# Patient Record
Sex: Male | Born: 1980 | Race: Black or African American | Hispanic: No | Marital: Single | Smoking: Never smoker
Health system: Southern US, Community
[De-identification: ages and names within clinical notes are randomized; demographics above are authoritative.]

## PROBLEM LIST (undated history)

## (undated) DIAGNOSIS — Z789 Other specified health status: Secondary | ICD-10-CM

## (undated) DIAGNOSIS — F99 Mental disorder, not otherwise specified: Secondary | ICD-10-CM

## (undated) HISTORY — PX: NO PAST SURGERIES: SHX2092

---

## 2012-07-27 ENCOUNTER — Ambulatory Visit (HOSPITAL_COMMUNITY)
Admission: RE | Admit: 2012-07-27 | Discharge: 2012-07-27 | Disposition: A | Discharge status: Home / Self Care | Payer: Self-pay | Attending: Psychiatry | Admitting: Psychiatry

## 2012-07-27 ENCOUNTER — Encounter (HOSPITAL_COMMUNITY): Payer: Self-pay

## 2012-07-27 ENCOUNTER — Emergency Department (HOSPITAL_COMMUNITY)
Admission: EM | Admit: 2012-07-27 | Discharge: 2012-07-28 | Disposition: A | Discharge status: Other Acute Inpatient Hospital | Payer: Self-pay | Attending: Emergency Medicine | Admitting: Emergency Medicine

## 2012-07-27 ENCOUNTER — Encounter (HOSPITAL_COMMUNITY): Payer: Self-pay | Admitting: Licensed Clinical Social Worker

## 2012-07-27 DIAGNOSIS — F29 Unspecified psychosis not due to a substance or known physiological condition: Secondary | ICD-10-CM | POA: Insufficient documentation

## 2012-07-27 DIAGNOSIS — Z91018 Allergy to other foods: Secondary | ICD-10-CM | POA: Insufficient documentation

## 2012-07-27 DIAGNOSIS — R443 Hallucinations, unspecified: Secondary | ICD-10-CM | POA: Insufficient documentation

## 2012-07-27 DIAGNOSIS — Z833 Family history of diabetes mellitus: Secondary | ICD-10-CM | POA: Insufficient documentation

## 2012-07-27 HISTORY — DX: Other specified health status: Z78.9

## 2012-07-27 LAB — RAPID URINE DRUG SCREEN, HOSP PERFORMED
Amphetamines: NOT DETECTED
Barbiturates: NOT DETECTED
Opiates: NOT DETECTED
Tetrahydrocannabinol: NOT DETECTED

## 2012-07-27 LAB — CBC WITH DIFFERENTIAL/PLATELET
Basophils Absolute: 0 10*3/uL (ref 0.0–0.1)
Basophils Relative: 0 % (ref 0–1)
Eosinophils Absolute: 0 10*3/uL (ref 0.0–0.7)
Hemoglobin: 14.5 g/dL (ref 13.0–17.0)
MCH: 28.8 pg (ref 26.0–34.0)
MCHC: 35.1 g/dL (ref 30.0–36.0)
Neutro Abs: 3.1 10*3/uL (ref 1.7–7.7)
Neutrophils Relative %: 64 % (ref 43–77)
Platelets: 249 10*3/uL (ref 150–400)
RDW: 11.8 % (ref 11.5–15.5)

## 2012-07-27 LAB — COMPREHENSIVE METABOLIC PANEL
AST: 23 U/L (ref 0–37)
Albumin: 4.5 g/dL (ref 3.5–5.2)
Alkaline Phosphatase: 76 U/L (ref 39–117)
BUN: 8 mg/dL (ref 6–23)
Chloride: 98 mEq/L (ref 96–112)
Potassium: 3.7 mEq/L (ref 3.5–5.1)
Sodium: 134 mEq/L — ABNORMAL LOW (ref 135–145)
Total Bilirubin: 1 mg/dL (ref 0.3–1.2)
Total Protein: 8.3 g/dL (ref 6.0–8.3)

## 2012-07-27 MED ORDER — ZIPRASIDONE MESYLATE 20 MG IM SOLR
20.0000 mg | Freq: Once | INTRAMUSCULAR | Status: AC
  Administered 2012-07-28: 20 mg via INTRAMUSCULAR
  Filled 2012-07-27: qty 20

## 2012-07-27 MED ORDER — LORAZEPAM 1 MG PO TABS: 1.0000 mg | ORAL_TABLET | Freq: Three times a day (TID) | ORAL | Status: DC | PRN

## 2012-07-27 MED ORDER — ACETAMINOPHEN 325 MG PO TABS: 650.0000 mg | ORAL_TABLET | ORAL | Status: DC | PRN

## 2012-07-27 MED ORDER — IBUPROFEN 600 MG PO TABS: 600.0000 mg | ORAL_TABLET | Freq: Three times a day (TID) | ORAL | Status: DC | PRN

## 2012-07-27 NOTE — BH Assessment (Signed)
Assessment Note   Gerald Ballard is an 32 y.o. male, single, African-American who presents to [facility] for assessment accompanied by his aunt who participated in the assessment at the Pt's request. Pt states he is here because he can make time move forward and backward through the television. Pt appears very delusional and talks about another reality where he "is big" and that here people try to take things from him. He says "there is a black man and a white man who are on life support and one has to leave... What do you do? That is the question." He is not oriented to date, place or situation and could not answer some questions appropriately. He could not appropriately answer if he is experiencing auditory or visual hallucination. He denies being depressed. He denies suicidal ideation or history of self-harm. He denies homicidal ideation or history of violence. He denies legal charges. He cannot appropriately answer if he is employed. When asked if he uses alcohol or substances he replied "Ahh! That is what I want to know. We should table this." He cannot says how long he has had these symptoms.   Pt's aunt reports Pt came to their residence saying he didn't feel safe and wanted to stay at their house. Aunt says Pt has moved four times over a brief period and they asked where he was staying but he couldn't remember the address. He eventually led them to an apartment the Pt shares with 3 other men and Pt had covered all the vent because he believe people are trying to contaminate him. He believes people in other apartments are talking about him. "I don't want to be outside because there is too much outside so it is better to stay in my room asleep... I fear for my safety." Aunt reports Pt moved to [place] from [place] four years ago. Per aunt he lost his job at [employer] approximately 1 year ago and she doesn't think he has been working. Aunt is not aware of any family history of mental health problems and does not believe  Pt has seen a psychiatrist or counselor in the past.  During assessment Pt was hypervigilant and preoccupied with small details. He talked about my "specticals" and that he had "seen in another realm that you are the person who wears specticals." He was pleasant, cooperative and followed directions but has poor insight and judgment.  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 No pertinent past medical history    Axis IV: economic problems, occupational problems and problems with access to health care services Axis V: GAF = 20  Past Medical History:  Past Medical History  Diagnosis Date  . No pertinent past medical history     Past Surgical History  Procedure Date  . No past surgeries     Family History:  Family History  Problem Relation Age of Onset  . Diabetes Father     Social History:  reports that he has never smoked. He does not have any smokeless tobacco history on file. He reports that he drinks alcohol. He reports that he does not use illicit drugs.  Additional Social History:  Alcohol / Drug Use Pain Medications: Unknown Prescriptions: Unknown Over the Counter: Unknown History of alcohol / drug use?: No history of alcohol / drug abuse Longest period of sobriety (when/how long): NA  CIWA:   COWS:    Allergies:  Allergies  Allergen  Reactions  . Pork-Derived Products     unknown    Home Medications:  (Not in a hospital admission)  OB/GYN Status:  No LMP for male patient.  General Assessment Data Location of Assessment: [facility] Assessment Services Living Arrangements: Other (Comment) (Roommates) Can pt return to current living arrangement?: Yes Admission Status: Voluntary Is patient capable of signing voluntary admission?: Yes Transfer from: Home Referral Source: Self/Family/Friend (Family)  Education Status Is patient currently in school?: No Current Grade: NA Highest grade of school  patient has completed: NA Name of school: NA Contact person: NA  Risk to self Suicidal Ideation: No Suicidal Intent: No Is patient at risk for suicide?: No Suicidal Plan?: No Access to Means: No What has been your use of drugs/alcohol within the last 12 months?: Pt unable to answer Previous Attempts/Gestures: No How many times?: 0  Other Self Harm Risks: None Triggers for Past Attempts: None known Intentional Self Injurious Behavior: None Family Suicide History: No Recent stressful life event(s): Job Loss;Financial Problems Persecutory voices/beliefs?: Yes (Apartment is contaminated, people talking about him) Depression: No Depression Symptoms: Insomnia;Loss of interest in usual pleasures;Isolating Substance abuse history and/or treatment for substance abuse?: No Suicide prevention information given to non-admitted patients: Not applicable  Risk to Others Homicidal Ideation: No Thoughts of Harm to Others: No Current Homicidal Intent: No Current Homicidal Plan: No Access to Homicidal Means: No Identified Victim: None History of harm to others?: No Assessment of Violence: None Noted Violent Behavior Description: Pt denies any history of violence Does patient have access to weapons?: No Criminal Charges Pending?: No Does patient have a court date: No  Psychosis Hallucinations: None noted (Unclear whether Pt is responding to hallucinations) Delusions: Grandiose;Persecutory (Believes he is viewing past and future events)  Mental Status Report Appear/Hygiene: Other (Comment) (Casually dressed) Eye Contact: Good Motor Activity: Restlessness Speech: Tangential Level of Consciousness: Alert Mood: Preoccupied Affect: Preoccupied Anxiety Level: Minimal Thought Processes: Tangential Judgement: Impaired Orientation: Person (Not oriented to date, place or situation) Obsessive Compulsive Thoughts/Behaviors: None  Cognitive Functioning Concentration: Decreased Memory: Recent  Intact;Remote Intact IQ: Average Insight: Poor Impulse Control: Fair Appetite: Fair Weight Loss: 0  Weight Gain: 0  Sleep: Decreased Total Hours of Sleep: 6  Vegetative Symptoms: None  ADLScreening [facility] Assessment Services) Patient's cognitive ability adequate to safely complete daily activities?: Yes Patient able to express need for assistance with ADLs?: Yes Independently performs ADLs?: Yes (appropriate for developmental age)  Abuse/Neglect [place] [place]) Physical Abuse: Denies Verbal Abuse: Denies Sexual Abuse: Denies  Prior Inpatient Therapy Prior Inpatient Therapy: No Prior Therapy Dates: NA Prior Therapy Facilty/Provider(s): NA Reason for Treatment: NA  Prior Outpatient Therapy Prior Outpatient Therapy: No Prior Therapy Dates: NA Prior Therapy Facilty/Provider(s): NA Reason for Treatment: NA  ADL Screening (condition at time of admission) Patient's cognitive ability adequate to safely complete daily activities?: Yes Patient able to express need for assistance with ADLs?: Yes Independently performs ADLs?: Yes (appropriate for developmental age) Weakness of Legs: None Weakness of Arms/Hands: None  Home Assistive Devices/Equipment Home Assistive Devices/Equipment: None    Abuse/Neglect Assessment (Assessment to be complete while patient is alone) Physical Abuse: Denies Verbal Abuse: Denies Sexual Abuse: Denies Exploitation of patient/patient's resources: Denies Self-Neglect: Denies     Merchant navy officer (For Healthcare) Advance Directive: Patient does not have advance directive;Patient would not like information Pre-existing out of facility DNR order (yellow form or pink MOST form): No Nutrition Screen- [facility] Adult/[facility] Patient's home diet: Regular Have you recently lost weight without trying?: No  Have you been eating poorly because of a decreased appetite?: No Malnutrition Screening Tool Score: 0   Additional Information 1:1 In Past 12 Months?: No CIRT Risk:  No Elopement Risk: No Does patient have medical clearance?: No     Disposition:  Disposition Disposition of Patient: Other dispositions Other disposition(s): Other (Comment) (Transfer to [facility] for medical clearance)  On Site Evaluation by:   Reviewed with Physician: Armandina Stammer, NP  Consulted with Thurman Coyer, [facility] who confirmed no 400-hall beds are currently available. Consulted with Armandina Stammer, NP who agreed Pt met criteria for inpatient treatment and recommended Pt be transferred to [facility] for medical clearance. Pt has not been accepted or declined at this time. Contacted Patty, Consulting civil engineer at [employer], and gave report. Pt agrees to go to [facility] for medical clearance. Pt transported to [facility] via security and [facility] staff.    Patsy Baltimore, Harlin Rain 07/27/2012 3:41 PM

## 2012-07-27 NOTE — ED Provider Notes (Addendum)
History     CSN: [id number]  Arrival date & time 07/27/12  1459   First MD Initiated Contact with Patient 07/27/12 1719      Chief Complaint  Patient presents with  . Medical Clearance    (Consider location/radiation/quality/duration/timing/severity/associated sxs/prior treatment) The history is provided by the patient.  Gerald Ballard is a 32 y.o. male here with hallucinations. Was watching the movie "Helix" and then was hearing voices. He said that every channel he turns to, he sees the police and he is afraid. He also sess electromagnetic waves through the television. No suicidal or homicidal ideations. No psych history in the past.    Past Medical History  Diagnosis Date  . No pertinent past medical history     Past Surgical History  Procedure Date  . No past surgeries     Family History  Problem Relation Age of Onset  . Diabetes Father     History  Substance Use Topics  . Smoking status: Never Smoker   . Smokeless tobacco: Not on file  . Alcohol Use: Yes     Comment: wine occasionally      Review of Systems  Psychiatric/Behavioral: Positive for hallucinations.  All other systems reviewed and are negative.    Allergies  Pork-derived products  Home Medications  No current outpatient prescriptions on file.  BP 140/89  Pulse 77  Temp 98.2 F (36.8 C) (Oral)  Resp 18  SpO2 99%  Physical Exam  Nursing note and vitals reviewed. Constitutional: He is oriented to person, place, and time. He appears well-developed and well-nourished.  HENT:  Head: Normocephalic.  Mouth/Throat: Oropharynx is clear and moist.  Eyes: Conjunctivae normal are normal. Pupils are equal, round, and reactive to light.  Neck: Normal range of motion. Neck supple.  Cardiovascular: Normal rate, regular rhythm and normal heart sounds.   Pulmonary/Chest: Effort normal and breath sounds normal. No respiratory distress. He has no wheezes. He has no rales.  Abdominal: Soft. Bowel sounds  are normal. He exhibits no distension. There is no tenderness. There is no rebound.  Musculoskeletal: Normal range of motion.  Neurological: He is alert and oriented to person, place, and time.  Skin: Skin is warm and dry.  Psychiatric:       Nl mood, poor judgment.     ED Course  Procedures (including critical care time)  Labs Reviewed  COMPREHENSIVE METABOLIC PANEL - Abnormal; Notable for the following:    Sodium 134 (*)     Glucose, Bld 107 (*)     All other components within normal limits  CBC WITH DIFFERENTIAL  URINE RAPID DRUG SCREEN (HOSP PERFORMED)  ETHANOL   No results found.   1. Hallucinations       MDM  Gerald Ballard is a 32 y.o. male here with hallucinations and psychosis. Likely new onset schizophrenia. Will check labs, UDS and get psych consult.    10:39 PM UDS negative. Labs unremarkable. Pending telepsych.   11:40 PM Patient agitated and endangering staff. Will give geodon. Will fill out IVC paperwork.     Richardean Canal, MD 07/27/12 1738  Richardean Canal, MD 07/27/12 2239  Richardean Canal, MD 07/27/12 [phone]

## 2012-07-27 NOTE — ED Notes (Signed)
Patient reports that he has been watching the movie "Helix" and thinks that those things will be happening to him. Patient states that he sees electromagnetic waves especially through the television. Patient states he hears voices and when he moves to one side of the other someone moves to the left or to the right and people have special interests which are going to hurt him and he feels afraid. Patient frequently states that time moves forward and when it does he is gong to gaet hurt and his goal is to keep everything in the middle because he does not know what interests people have.

## 2012-07-27 NOTE — ED Notes (Signed)
Wanded by security. Patient's belongings x 1 bag taken by the patient's aunt.

## 2012-07-28 ENCOUNTER — Inpatient Hospital Stay (HOSPITAL_COMMUNITY)
Admission: AD | Admit: 2012-07-28 | Discharge: 2012-08-09 | DRG: 885 | Disposition: A | Discharge status: Home / Self Care | Payer: Federal, State, Local not specified - Other | Source: Ambulatory Visit | Attending: Psychiatry | Admitting: Psychiatry

## 2012-07-28 ENCOUNTER — Emergency Department (HOSPITAL_COMMUNITY): Payer: Self-pay

## 2012-07-28 DIAGNOSIS — Z79899 Other long term (current) drug therapy: Secondary | ICD-10-CM

## 2012-07-28 DIAGNOSIS — F062 Psychotic disorder with delusions due to known physiological condition: Secondary | ICD-10-CM | POA: Diagnosis present

## 2012-07-28 DIAGNOSIS — F29 Unspecified psychosis not due to a substance or known physiological condition: Principal | ICD-10-CM | POA: Diagnosis present

## 2012-07-28 DIAGNOSIS — F22 Delusional disorders: Secondary | ICD-10-CM | POA: Diagnosis present

## 2012-07-28 HISTORY — DX: Mental disorder, not otherwise specified: F99

## 2012-07-28 MED ORDER — ZIPRASIDONE HCL 20 MG PO CAPS: 20.0000 mg | ORAL_CAPSULE | Freq: Two times a day (BID) | ORAL | Status: DC

## 2012-07-28 NOTE — ED Notes (Signed)
Pt agitated pacing hallway stating "Im leaving". Pt informed that he is IVC and cant leave.

## 2012-07-28 NOTE — [facility] Counselor (Signed)
Per Minerva Areola K([facility]), pt accepted for treatment; attending physician Dr. Ambrose Pancoast

## 2012-07-28 NOTE — Progress Notes (Signed)
[facility] ED CM reviewed ED clinical summary and EDP note Clinical values including VSS Pt from [facility] to [facility] ED for medical clearance No beds available per [facility] staff on time of transfer to [facility] ED.  Needs inquiry on available bed for pt now pt medically cleared

## 2012-07-28 NOTE — ED Provider Notes (Signed)
950 pm pt alert pleasant cooperative, gcs 15. Stable for transfer to [facility]  Doug Sou, MD 07/28/12 2158

## 2012-07-28 NOTE — ED Provider Notes (Addendum)
0720.  Pt sleep in bed, NAD.  No acute overnight events reported.  Pt presented secondary to hallucinations.  Note mild hyponatremia but otherwise normal labs.  Placed consult to telepsych provider for recommendations regarding his care and disposition.  Noted mild tachycardia on last available vital signs.  Will follow closely and if necessary, will broaden his evaluation.   Tobin Chad, MD 07/28/12 0801  1120.  Tachycardia has been resolved.  He was evaluated by Dr. Berlin Hun (telepsych provider) and recommended for inpt placement secondary to psychosis.  Will consult the ACT Team to assist with placement.   Tobin Chad, MD 07/28/12 1124

## 2012-07-28 NOTE — ED Notes (Signed)
Dad at beside.

## 2012-07-29 ENCOUNTER — Encounter (HOSPITAL_COMMUNITY): Payer: Self-pay

## 2012-07-29 DIAGNOSIS — F29 Unspecified psychosis not due to a substance or known physiological condition: Principal | ICD-10-CM

## 2012-07-29 DIAGNOSIS — F062 Psychotic disorder with delusions due to known physiological condition: Secondary | ICD-10-CM | POA: Diagnosis present

## 2012-07-29 MED ORDER — LORAZEPAM 1 MG PO TABS
2.0000 mg | ORAL_TABLET | Freq: Four times a day (QID) | ORAL | Status: DC | PRN
  Administered 2012-07-29: 2 mg via ORAL
  Filled 2012-07-29 (×2): qty 2

## 2012-07-29 MED ORDER — ACETAMINOPHEN 325 MG PO TABS: 650.0000 mg | ORAL_TABLET | Freq: Four times a day (QID) | ORAL | Status: DC | PRN

## 2012-07-29 MED ORDER — HYDROXYZINE HCL 25 MG PO TABS: 25.0000 mg | ORAL_TABLET | Freq: Four times a day (QID) | ORAL | Status: DC | PRN

## 2012-07-29 MED ORDER — ALUM & MAG HYDROXIDE-SIMETH 200-200-20 MG/5ML PO SUSP: 30.0000 mL | ORAL | Status: DC | PRN

## 2012-07-29 MED ORDER — HALOPERIDOL LACTATE 2 MG/ML PO CONC
2.0000 mg | Freq: Two times a day (BID) | ORAL | Status: DC
  Administered 2012-07-29 – 2012-08-01 (×6): 2 mg via ORAL
  Filled 2012-07-29 (×14): qty 1

## 2012-07-29 MED ORDER — OLANZAPINE 10 MG PO TBDP: 10.0000 mg | ORAL_TABLET | Freq: Three times a day (TID) | ORAL | Status: DC | PRN

## 2012-07-29 MED ORDER — MAGNESIUM HYDROXIDE 400 MG/5ML PO SUSP: 30.0000 mL | Freq: Every day | ORAL | Status: DC | PRN

## 2012-07-29 MED ORDER — MIRTAZAPINE 15 MG PO TBDP
15.0000 mg | ORAL_TABLET | Freq: Every day | ORAL | Status: DC
  Administered 2012-07-29 – 2012-08-03 (×5): 15 mg via ORAL
  Filled 2012-07-29 (×7): qty 1

## 2012-07-29 MED ORDER — LISINOPRIL 10 MG PO TABS
10.0000 mg | ORAL_TABLET | Freq: Every day | ORAL | Status: DC
  Administered 2012-07-30 – 2012-08-09 (×11): 10 mg via ORAL
  Filled 2012-07-29: qty 14
  Filled 2012-07-29 (×10): qty 1
  Filled 2012-07-29: qty 14
  Filled 2012-07-29 (×2): qty 1

## 2012-07-29 MED ORDER — BENZTROPINE MESYLATE 0.5 MG PO TABS
0.5000 mg | ORAL_TABLET | Freq: Two times a day (BID) | ORAL | Status: DC
  Administered 2012-07-29 – 2012-08-01 (×6): 0.5 mg via ORAL
  Filled 2012-07-29 (×8): qty 1

## 2012-07-29 MED ORDER — LISINOPRIL 10 MG PO TABS
10.0000 mg | ORAL_TABLET | Freq: Three times a day (TID) | ORAL | Status: DC
  Administered 2012-07-29 (×2): 10 mg via ORAL
  Filled 2012-07-29 (×7): qty 1

## 2012-07-29 NOTE — Clinical Social Work Note (Signed)
[facility] LCSW Group Therapy   Type of Therapy:  Discharge Planning  Participation Level:  Did Not Attend   Clide Dales 07/29/2012 10:01 AM

## 2012-07-29 NOTE — H&P (Signed)
Psychiatric Admission Assessment Adult  Patient Identification:  Gerald Ballard Date of Evaluation:  07/29/2012  Chief Complaint:  Hearing voices, seeing things, paranoid, disorganized thinking  History of Present Illness::Gerald Ballard is an 32 y/o male, immigrant from [place] who was brought to the ED by his Aunt for evaluation. Patient reports that his roommates has been doing "weird stuffs" in the apartment he shares with them. He also reports that he recently saw a movie entitled "Helix" where a patient went to the hospital and never came back home. He reports he has since been thinking that could be him. Patient reports seeing things in his dream, hearing voices of no one in particular and has not been sleeping well, worry about "being cut open" in his sleep. He also states that  he can make time move forward and backward through the television.   He denies suicidal ideation or history of self-harm. He denies homicidal ideation or history of violence. According to the ER records, his aunt reports that he  came to their residence saying he didn't feel safe and wanted to stay at their house. Aunt says patient has moved four times over a brief period and they asked where he was staying but he couldn't remember the address. He eventually led them to an apartment the he shares with 3 other men and reports that he  had covered all the vents because he believe people are trying to contaminate him. He believes people in other apartments are talking about him. "I don't want to be outside because there is too much outside so it is better to stay in my room asleep... I fear for my safety." Aunt reports patient  moved to [place]  from [place] four years ago. He lost  his job at [employer] about one year ago. Aunt is not aware of any family history of mental health problems and does not believe he has seen a psychiatrist or counselor in the past.   Elements:  Location:  [facility] inpatient.. Quality:  patient reports  psychosis, weird dream . Severity:  symptoms severe enough to prevent patient from sleeping.. Timing:  onset unknown. Duration:  patient unable to report the duration of his sympotms.. Context:  unemployed, thinking that roommates are doing weird suffs. Associated Signs/Synptoms: Depression Symptoms:  disturbed sleep, (Hypo) Manic Symptoms:  Delusions, Distractibility, Hallucinations, Anxiety Symptoms:  Excessive Worry, Psychotic Symptoms:  Delusions, Hallucinations: Auditory Visual Paranoia, PTSD Symptoms: None reported  Psychiatric Specialty Exam: Physical Exam  Psychiatric: His mood appears anxious. His speech is tangential. He is actively hallucinating. Thought content is paranoid. Cognition and memory are normal. He expresses inappropriate judgment.    Review of Systems  Constitutional: Negative.   HENT: Negative.   Eyes: Negative.   Respiratory: Negative.   Cardiovascular: Negative.   Gastrointestinal: Negative.   Genitourinary: Negative.   Musculoskeletal: Negative.   Skin: Negative.   Neurological: Negative.   Endo/Heme/Allergies: Negative.   Psychiatric/Behavioral: Positive for hallucinations. The patient is nervous/anxious and has insomnia.     Blood pressure 125/91, pulse 134, temperature 98.2 F (36.8 C), temperature source Oral, resp. rate 20, height 5\' 9"  (1.753 m), weight 51.71 kg (114 lb).Body mass index is 16.83 kg/(m^2).  General Appearance: Bizarre, Disheveled and Guarded  Eye Contact::  Poor  Speech:  Blocked and Slow  Volume:  Decreased  Mood:  Anxious  Affect:  Blunt  Thought Process:  Circumstantial and Disorganized  Orientation:  Other:  oriented to place, person but not to  time  Thought Content:  Delusions, Hallucinations: Auditory Visual and Paranoid Ideation  Suicidal Thoughts:  No  Homicidal Thoughts:  No  Memory:  Immediate;   Fair Recent;   Fair Remote;   Fair  Judgement:  Poor  Insight:  Lacking  Psychomotor Activity:  Psychomotor  Retardation  Concentration:  Poor  Recall:  Fair  Akathisia:  No  Handed:  Right  AIMS (if indicated):     Assets:  Physical Health Social Support  Sleep:       Past Psychiatric History: Diagnosis:  Hospitalizations:  Outpatient Care:  Substance Abuse Care:  Self-Mutilation:  Suicidal Attempts:  Violent Behaviors:   Past Medical History:   Past Medical History  Diagnosis Date  . No pertinent past medical history   . Mental disorder     Allergies:   Allergies  Allergen Reactions  . Pork-Derived Products     unknown   PTA Medications: No prescriptions prior to admission    Previous Psychotropic Medications:  Medication/Dose: none in the past                 Substance Abuse History in the last 12 months:  no  Consequences of Substance Abuse: NA  Social History:  reports that he has never smoked. He does not have any smokeless tobacco history on file. He reports that he drinks alcohol. He reports that he does not use illicit drugs. Additional Social History:                      Current Place of Residence:   Place of Birth:   Family Members: Marital Status:  Single Children:  Sons:  Daughters: Relationships: Education:  Corporate treasurer Problems/Performance: Religious Beliefs/Practices: History of Abuse (Emotional/Phsycial/Sexual) Teacher, music History:  None. Legal History: Hobbies/Interests:  Family History:   Family History  Problem Relation Age of Onset  . Diabetes Father     Results for orders placed during the hospital encounter of 07/27/12 (from the past 72 hour(s))  CBC WITH DIFFERENTIAL     Status: Normal   Collection Time   07/27/12  4:05 PM      Component Value Range Comment   WBC 4.9  4.0 - 10.5 K/uL    RBC 5.04  4.22 - 5.81 MIL/uL    Hemoglobin 14.5  13.0 - 17.0 g/dL    HCT 40.9  81.1 - 91.4 %    MCV 81.9  78.0 - 100.0 fL    MCH 28.8  26.0 - 34.0 pg    MCHC 35.1  30.0 - 36.0 g/dL     RDW 78.2  95.6 - 21.3 %    Platelets 249  150 - 400 K/uL    Neutrophils Relative 64  43 - 77 %    Neutro Abs 3.1  1.7 - 7.7 K/uL    Lymphocytes Relative 30  12 - 46 %    Lymphs Abs 1.4  0.7 - 4.0 K/uL    Monocytes Relative 6  3 - 12 %    Monocytes Absolute 0.3  0.1 - 1.0 K/uL    Eosinophils Relative 0  0 - 5 %    Eosinophils Absolute 0.0  0.0 - 0.7 K/uL    Basophils Relative 0  0 - 1 %    Basophils Absolute 0.0  0.0 - 0.1 K/uL   COMPREHENSIVE METABOLIC PANEL     Status: Abnormal   Collection Time   07/27/12  4:05 PM  Component Value Range Comment   Sodium 134 (*) 135 - 145 mEq/L    Potassium 3.7  3.5 - 5.1 mEq/L    Chloride 98  96 - 112 mEq/L    CO2 28  19 - 32 mEq/L    Glucose, Bld 107 (*) 70 - 99 mg/dL    BUN 8  6 - 23 mg/dL    Creatinine, Ser 1.61  0.50 - 1.35 mg/dL    Calcium 9.7  8.4 - 09.6 mg/dL    Total Protein 8.3  6.0 - 8.3 g/dL    Albumin 4.5  3.5 - 5.2 g/dL    AST 23  0 - 37 U/L    ALT 26  0 - 53 U/L    Alkaline Phosphatase 76  39 - 117 U/L    Total Bilirubin 1.0  0.3 - 1.2 mg/dL    GFR calc non Af Amer >90  >90 mL/min    GFR calc Af Amer >90  >90 mL/min   ETHANOL     Status: Normal   Collection Time   07/27/12  4:05 PM      Component Value Range Comment   Alcohol, Ethyl (B) <11  0 - 11 mg/dL   URINE RAPID DRUG SCREEN (HOSP PERFORMED)     Status: Normal   Collection Time   07/27/12  5:03 PM      Component Value Range Comment   Opiates NONE DETECTED  NONE DETECTED    Cocaine NONE DETECTED  NONE DETECTED    Benzodiazepines NONE DETECTED  NONE DETECTED    Amphetamines NONE DETECTED  NONE DETECTED    Tetrahydrocannabinol NONE DETECTED  NONE DETECTED    Barbiturates NONE DETECTED  NONE DETECTED    Psychological Evaluations:  Assessment:   AXIS I:  Psychotic Disorder NOS AXIS II:  Deferred AXIS III:   Past Medical History  Diagnosis Date  . No pertinent past medical history   . Mental disorder    AXIS IV:  economic problems, occupational problems and  other psychosocial or environmental problems AXIS V:  21-30 behavior considerably influenced by delusions or hallucinations OR serious impairment in judgment, communication OR inability to function in almost all areas  Treatment Plan/Recommendations:  1. Admit for crisis management and stabilization. 2. Medication management to reduce current symptoms to base line and improve the patient's overall level of functioning 3. Treat health problems as indicated. 4. Develop treatment plan to decrease risk of relapse upon discharge and the need for readmission. 5. Psycho-social education regarding relapse prevention and self care. 6. Health care follow up as needed for medical problems. 7. Restart home medications where appropriate.    Treatment Plan Summary: Daily contact with patient to assess and evaluate symptoms and progress in treatment Medication management Current Medications:  Current Facility-Administered Medications  Medication Dose Route Frequency Provider Last Rate Last Dose  . acetaminophen (TYLENOL) tablet 650 mg  650 mg Oral Q6H PRN Sanjuana Kava, NP      . alum & mag hydroxide-simeth (MAALOX/MYLANTA) 200-200-20 MG/5ML suspension 30 mL  30 mL Oral Q4H PRN Sanjuana Kava, NP      . hydrOXYzine (ATARAX/VISTARIL) tablet 25 mg  25 mg Oral QID PRN Sanjuana Kava, NP      . lisinopril (PRINIVIL,ZESTRIL) tablet 10 mg  10 mg Oral TID Sanjuana Kava, NP   10 mg at 07/29/12 0730  . magnesium hydroxide (MILK OF MAGNESIA) suspension 30 mL  30 mL Oral Daily PRN Nicole Kindred  Nicanor Alcon, NP        Observation Level/Precautions:  routine  Laboratory:  routine  Psychotherapy:    Medications:    Consultations:    Discharge Concerns:    Estimated LOS:  Other:     I certify that inpatient services furnished can reasonably be expected to improve the patient's condition.   Thedore Mins, MD 1/3/201410:19 AM

## 2012-07-29 NOTE — Tx Team (Signed)
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                                                      DISCHARGE CRITERIA:  Ability to meet basic life and health needs Improved stabilization in mood, thinking, and/or behavior Medical problems require only outpatient monitoring Motivation to continue treatment in a less acute level of care Need for constant or close observation no longer present Verbal commitment to aftercare and medication compliance  PRELIMINARY DISCHARGE PLAN: Attend aftercare/continuing care group Attend PHP/IOP Outpatient therapy Return to previous living arrangement Return to previous work or school arrangements  PATIENT/FAMIILY INVOLVEMENT: This treatment plan has been presented to and reviewed with the patient, Gerald Ballard, and/or family member,  The patient and family have been given the opportunity to ask questions and make suggestions.  [name], [name] 07/29/2012, 1:12 AM

## 2012-07-29 NOTE — Progress Notes (Signed)
Patient ID: Gerald Ballard, male   DOB: [birth year], 32 y.o.   MRN: [id number] Admission note: D:Patient is an Involuntary admission in no acute distress for psychosis. Pt's mood and affect is anxious. Pt is extremely anxious about been in a closed unit and stated  his safety is very important to him. Pt stated "he thinks something is going to happen to me". Pt stated "feeling wired" about his environment. Pt is standing at his room door anxious about going to sleep. Pt B/P was 148/110. P 114 Pt denies SI/HI/AVH and pain.  A: Pt admitted to unit per protocol, skin assessment and search done.  Pt educated on therapeutic milieu rules. Pt was introduced to milieu by nursing staff.  lisinopril 10 mg administered. Pt refused vistaril stating he does like taking medication.  R: Pt is extremely anxious. Pt was receptive to education. 15 min safety checks started. Clinical research associate offered support

## 2012-07-29 NOTE — Progress Notes (Signed)
Patient ID: Gerald Ballard, male   DOB: 09/13/1980, 32 y.o.   MRN: [id number] D: Patient in room on approach. Pt presented with depressed mood anxiety. Pt continues to be paranoid believes the staff will cut him open. Pt continues to ask staff to guarantee his safety. Pt attended evening wrap up group. Pt denies SI/HI/AVH and pain. Cooperative with assessment. No acute distressed noted at this time. Pt encouraged to come to staff with any question or concerns   A: Met with pt 1:1. Medications administered as prescribed. Safety has been maintained with Q15 minutes observation. Supported and encouragement provided to attend groups.   R: Patient remains safe. He is complaint with medications and group programming. Safety has been maintained Q15 and continue current POC

## 2012-07-29 NOTE — Clinical Social Work Note (Deleted)
[facility] LCSW Group Therapy   Type of Therapy:  Discharge Planning  Participation Level:  Did Not Attend   Clide Dales 07/29/2012 11:47 AM

## 2012-07-29 NOTE — [facility] Suicide Risk Assessment (Signed)
Suicide Risk Assessment  Admission Assessment     Nursing information obtained from:  Patient Demographic factors:  Male;Adolescent or young adult;Low socioeconomic status;Living alone;Unemployed Current Mental Status:  NA Loss Factors:  Financial problems / change in socioeconomic status Historical Factors:  NA Risk Reduction Factors:  NA  CLINICAL FACTORS:   Severe Anxiety and/or Agitation Currently Psychotic  COGNITIVE FEATURES THAT CONTRIBUTE TO RISK:  Closed-mindedness Polarized thinking    SUICIDE RISK:   Minimal: No identifiable suicidal ideation.  Patients presenting with no risk factors but with morbid ruminations; may be classified as minimal risk based on the severity of the depressive symptoms  PLAN OF CARE:1. Admit for crisis management and stabilization. 2. Medication management to reduce current symptoms to base line and improve the patient's overall level of functioning 3. Treat health problems as indicated. 4. Develop treatment plan to decrease risk of relapse upon discharge and the need for readmission. 5. Psycho-social education regarding relapse prevention and self care. 6. Health care follow up as needed for medical problems. 7. Restart home medications where appropriate.     Gerald Hoheisel,MD 07/29/2012, 10:18 AM

## 2012-07-29 NOTE — Tx Team (Signed)
Interdisciplinary Treatment Plan (Adult)  Date:  07/29/2012 Time Reviewed:  9:10 AM  Progress in Treatment: Attending groups: Yes. Participating in groups:  Yes. Taking medication as prescribed:  Yes. Tolerating medication:  Yes. Family/Significant othe contact made:  No, will contact:  once patient provides consent Patient understands diagnosis:  Unknown Discussing patient identified problems/goals with staff:  Not as yet Medical problems stabilized or resolved:  Yes. Denies suicidal/homicidal ideation: Yes. Issues/concerns per patient self-inventory: Patient has not completed as yet Other:  New problem(s) identified: No, Describe:  New patient staff had not met with as yet  Discharge Plan or Barriers: None known as yet  Reason for Continuation of Hospitalization: Other; describe continued psychosis  Comments: New patient, patient has not been seen as yet.   Estimated length of stay: 3-5 days  New goal(s): None at this time  Review of initial/current patient goals per problem list:   1.  Goal(s): Reduce Psychosis  Met:  No  Target date: Discharge  As evidenced by: Observation and patient report  2.  Goal (s): Obtain follow up treatment  Met:  No  Target date: Discharge  As evidenced by: Followup appointments in place   Attendees:       Physician:  Thedore Mins 07/29/2012 9:10 AM  Nursing:      Clinical Social Worker: Ronda Fairly 07/29/2012 9:10 AM                                    Scribe for Treatment Team:   Clide Dales, 07/29/2012, 5:34 PM

## 2012-07-29 NOTE — Progress Notes (Signed)
D: Patient denies SI/HI and auditory hallucinations. The patient states he has visual hallucinations and that he "sees things in dreams." The patient has an anxious and preoccupied mood and affect. The patient did not sleep last night and is disorganized and paranoid on assessment. The patient kept asking if staff were going to "cut him open" and kept asking if he is safe here. The patient states that he believes his roommates were trying to "do something" to him although he cannot specify. The patient is guarded and exhibits some thought blocking during interactions.  A: Patient given emotional support from RN. Patient encouraged to come to staff with concerns and/or questions. Patient's medication routine continued. Patient's orders and plan of care reviewed.  R: Patient remains safe. Will continue to monitor patient q15 minutes for safety.

## 2012-07-29 NOTE — Clinical Social Work Note (Signed)
[facility] LCSW Group Therapy   Type of Therapy:  Group Therapy  Participation Level:  Minimal  Participation Quality:  Sharing  Affect:  Blunted  Cognitive:  Oriented  Insight:  Limited  Engagement in Therapy:  Limited  Modes of Intervention:  Exploration and Support  Summary of Progress/Problems:  Pt was present but resistant to sharing.  When questioned directly patient was able to state that he was uncomfortable with another patient staring intently at him.  Patient was able to process how this made him feel and request other patient to stop.  Patient accepted support from others but then appeared to respond to internal stimuli until he asked to be excused and left group room.  Clide Dales 07/29/2012 5:21 PM

## 2012-07-30 DIAGNOSIS — F062 Psychotic disorder with delusions due to known physiological condition: Secondary | ICD-10-CM

## 2012-07-30 NOTE — Progress Notes (Signed)
Patient ID: Gerald Ballard, male   DOB: 06/21/1981, 32 y.o.   MRN: [id number] Pt reports good sleep and appetite.  He occasionally hears someone knocking at the door, but no one is there.  He reports that he went to the cafe this AM for breakfast, but had lunch brought to him.   Denies neuro/GI/MS difficulties. Dan Humphreys, Blake Goya

## 2012-07-30 NOTE — Progress Notes (Signed)
Psychoeducational Group Note  Date:  07/30/2012 Time:  1515  Group Topic/Focus:  Self Care:   The focus of this group is to help patients understand the importance of self-care in order to improve or restore emotional, physical, spiritual, interpersonal, and financial health.  Participation Level:  Did Not Attend  Participation Quality:    Affect:   Cognitive:    Insight:    Engagement in Group:    Additional Comments:  none  Marquis Lunch, Herb Beltre 07/30/2012, 7:26 PM

## 2012-07-30 NOTE — Progress Notes (Signed)
The focus of this group is to help patients review their daily goal of treatment and discuss progress on daily workbooks. Pt attended the evening group session and remained for the duration of it. Pt appeared uncomfortable to be there and even more uncomfortable to speak, but did respond to all prompts by the Writer. Pt reported that he was feeling thankful today for his blessings, which he felt good about.

## 2012-07-30 NOTE — Progress Notes (Signed)
Psychoeducational Group Note  Date:  07/30/2012 Time:  0945 am  Group Topic/Focus:  Identifying Needs:   The focus of this group is to help patients identify their personal needs that have been historically problematic and identify healthy behaviors to address their needs.  Participation Level:  Did Not Attend   Andrena Mews 07/30/2012,10:38 AM

## 2012-07-30 NOTE — Clinical Social Work Psychosocial (Signed)
[facility] Group Notes: (Clinical Social Work)   07/30/2012      Type of Therapy:  Group Therapy   Participation Level:  Did Not Attend    Jullien Granquist Grossman-Orr, LCSW 07/30/2012, 12:37 PM     

## 2012-07-30 NOTE — Progress Notes (Signed)
07/30/12 11:06 AM NSG shift assessment 7a-7p. D: Affect blunted, mood depressed and anxious. Behavior guarded and isolative. Stays in bed. Did not attend group. No complaints voiced but appears fearful. Occasionally he peeps out of his door down the hall, but returns to his room. When in bed he pulls the covers halfway up his face if someone enters. Denies feeling fearful. A: Spent 1:1 time with pt. Observed behavior in the milieu: Support and encouragement offered. R: Safety maintained on the unit. Continued q15 min observations and plan of care.

## 2012-07-31 NOTE — Progress Notes (Signed)
Gerald Ballard is visible on the unit today. He is observed interacting with others. Denies SI. Rates depression/hopelessness at a 2. Denies feeling anxious. Patient talking some about leaving "soon." He is expecting some family visitors this afternoon.  Gerald Ballard encouraged to take his five pm medications. He look alarmed and asked writer "What medications exactly." Patient very reluctant to take them and reports that he will when visitors arrive. Patient reminded that he will be expected to take them after dinner. The patient seemed very suspicious about the interaction.  R-Will continue to encourage patient to comply with treatment. He remains safe on the unit.

## 2012-07-31 NOTE — Progress Notes (Signed)
Psychoeducational Group Note  Date:  07/31/2012 Time:  2000  Group Topic/Focus:  Wrap-Up Group:   The focus of this group is to help patients review their daily goal of treatment and discuss progress on daily workbooks.  Participation Level:  Active  Participation Quality:  Appropriate  Affect:  Appropriate  Cognitive:  Appropriate  Insight:  Engaged  Engagement in Group:  Engaged  Additional Comments:  Patient attended and participated in group tonight. He reports having a mixed day. He was hoping to go home today, but that did not work out. Today he received his clothing and attended groups. Patient advised that his support group was his family, his school and his neighbor. He reports that his apartment is still available to him upon discharge.  Lita Mains [facility] 07/31/2012, 10:35 PM

## 2012-07-31 NOTE — Progress Notes (Signed)
Patient ID: Gerald Ballard, male   DOB: 1981/07/08, 32 y.o.   MRN: [id number] D)  Has been out on the hall more today, stated he felt better about the food and had a little more to eat today after going to the cafeteria, but feels uneasy about being here.  Stated had trouble sleeping last night d/t the commotion on the hall from other patients, as well as q 15 minute checks, but didn't want to take the hs dose of remeron that was scheduled.  Pt across the hall from him was given a Do Not Admit order d/t disruptive and  inappropriate behavior, and he frequently looks out his door into the hall to check that pt's whereabouts.  States is tired and finds it hard to rest here, didn't think there was anything I could do to help, just looking forward to discharge, but not sure yet of plans. A)  Will continue to monitor q 15 minutes for safety, encourage pt to participate more in groups, gio to meals, etc. Continue POC. R)  Cautious, guarded, safety maintained.

## 2012-07-31 NOTE — [facility] Counselor (Signed)
Adult Comprehensive Assessment  Patient ID: Gerald Ballard, male   DOB: April 02, 1981, 32 y.o.   MRN: [id number]  Information Source: Information source: Patient  Current Stressors:  Educational / Learning stressors: Denies Employment / Job issues: Denies Family Relationships: Denies Surveyor, quantity / Lack of resources (include bankruptcy): Depends on what is available and what is not Housing / Lack of housing: Doesn't always know where he will stay - wants to go from here to his apartment or to family's place Physical health (include injuries & life threatening diseases): Denies Social relationships: Good Substance abuse: Denies Bereavement / Loss: Denies  Living/Environment/Situation:  Living Arrangements: Alone Living conditions (as described by patient or guardian): in an apartment, not that pretty, but needs were met How long has patient lived in current situation?: 1 month What is atmosphere in current home: Comfortable  Family History:  Marital status: Single Does patient have children?: No  Childhood History:  By whom was/is the patient raised?: Both parents Additional childhood history information: Was raised in [place], and birth parents are stlil in [place].  Has been here since 2010 Description of patient's relationship with caregiver when they were a child: Good relationship with both of them (mother took care more of church and father of work) Patient's description of current relationship with people who raised him/her: Good, talks with both on the phone Does patient have siblings?: Yes Number of Siblings: 4  (3 brothers, 1 sister) Description of patient's current relationship with siblings: Good, talks on the phone Did patient suffer any verbal/emotional/physical/sexual abuse as a child?: No Did patient suffer from severe childhood neglect?: No Has patient ever been sexually abused/assaulted/raped as an adolescent or adult?: No Was the patient ever a victim of a crime or a  disaster?: No Witnessed domestic violence?: Yes (Patient stood between parents to prevent escalation) Has patient been effected by domestic violence as an adult?: No Description of domestic violence: Parents had disagreements, and patient kept them from escalating to violence  Education:  Highest grade of school patient has completed: 4 years college, but did not receive the bachelor's degree Currently a student?: Yes If yes, how has current illness impacted academic performance: "My performance went a little bit down.  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, production How long has patient been employed?: 1 year Patient's job has been impacted by current illness: Yes Describe how patient's job has been implacted: missing work What is the longest time patient has a held a job?: 1-2 years Where was the patient employed at that time?: [employer] Has patient ever been in the [employer]?: No Has patient ever served in Buyer, retail?: No  Financial Resources:   Financial resources: Income from employment (Part-time job and school aid) Does patient have a Lawyer or guardian?: No  Alcohol/Substance Abuse:   What has been your use of drugs/alcohol within the last 12 months?: Denies Alcohol/Substance Abuse Treatment Hx: Denies past history Has alcohol/substance abuse ever caused legal problems?: No  Social Support System:   Forensic psychologist System: Fair Museum/gallery exhibitions officer System: Employers, school, family here in the [place] Type of faith/religion: Ephriam Knuckles How does patient's faith help to cope with current illness?: Pray to God for protection and guidance.  Leisure/Recreation:   Leisure and Hobbies: "Normally I would write, hang  out, watch TV"  Strengths/Needs:   What things does the patient do well?: "If there was a way, I would  be in advertisement.", work skills, work Associate Professor, good at school. In what areas does patient struggle / problems for patient: Relationship issues, does not have a strong financial backing to prevent him from going into chaos.  Discharge Plan:   Does patient have access to transportation?: Yes (If calls family) Will patient be returning to same living situation after discharge?: No Plan for living situation after discharge: Doesn't know what is happening with his apartment, so will go to uncle's home first to find out. Currently receiving community mental health services: No If no, would patient like referral for services when discharged?: Yes (What county?) Medical sales representative) Does patient have financial barriers related to discharge medications?: Yes Patient description of barriers related to discharge medications: It will depend on the price of the medications.  Summary/Recommendations:   Summary and Recommendations (to be completed by the evaluator): This is a 32yo African male who apparently moved to [place] from [place] about 4 years ago.  He was quite delusional upon admission and was not oriented sufficiently to do an assessment.  There were family reports of paranoia, loss of job, fear which resulted in patient leaving his apartment and coming to their home.  Today the patient was oriented and able to state that he has not previously had mental health care, but is willing to follow up with appointments and medication if it is deemed to be necessary for him.  He has an apartment but will first go to his uncle's house to find out if his apartment is okay.  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  The patient would benefit from  safety monitoring, medication evaluation, psychoeducation, group therapy, and discharge planning to link with ongoing resources.   Sarina Ser. 07/31/2012

## 2012-07-31 NOTE — Progress Notes (Addendum)
Patient ID: Gerald Ballard, male   DOB: 09/28/1980, 32 y.o.   MRN: [id number] Pt seen in conference room and sounding more at ease and making more sense today.  He reports fair sleep and good appetite.  He denies any SI,HI,I,D, but notes only occasional people knocking on his door.  That actually could be the staff checking on him on the q15 minutes checks that are standard on the unit.  He is out of his room and interacting with other and attending groups.  A staff from his country spoke with him and gave him some reassurance yesterday and today.  That may have helped him, but he notes that the Haldol definitely helps him feel more calm.  Denies any neuro/GI/MS difficulties. Orson Aloe, MD, [place]

## 2012-07-31 NOTE — Progress Notes (Signed)
Patient ID: Gerald Ballard, male   DOB: [birth year], 32 y.o.   MRN: [id number] D)  Has stayed in his room most of the evening,  Was lying in bed with covers pulled up to his chin.  Was initially hesitant to talk, but when asked how he was, began sharing that he wasn't sure why he was here.  When asked if he remembered being confused, sounding like the tv was speaking to him, etc., he remembered his aunt looking at him when he was talking about a commercial and not understanding what he was saying.  Stated some things had gone on in the apt. With a guy who lived across from him, but wouldn't elaborate.  Stated he has been here in the [place] for 4 years and hasn't gotten to go back home and that has been hard.  States he was working and sending them money, but now is going to ask them to help him get a plane ticket round trip, so that he can go back home for a little while and see everyone.    Agreed to take his hs remeron, but then held it in his hand for a long time while he talked.  Asked several times about what we did here, what would happen to him, felt like a piece of a puzzle trying to fit in, but not sure what he needed to do.  Encouraged him to go to group and go to meals.  Stated he didn't like what they had brought him, that the food sometimes wasn't fixed the way he is accustomed in his country, and seemed relieved that he could actually pick out things he would like.  Asked several times about the pill, asked what it would do.  When he was told it would help him sleep, he finally asked if he would wake up, or would he go on to "a different place".  Stated had never been to a place like this and it became clear that he wasn't sure what was going to happen to him.  When he was finally assured that the medication was tohelp him sleep and that he would get up in the morning, go to breakfast and to plan to go to group, he seemed to finally begin to understand.  Asked about other patients here, and seemed to finally  understand when detox program was explained, and a hall for depression, etc. Then stated he was hungry, a salad was brought to him, and he eventually ate the salad, and has dozed off. A)  Will continue to monitor for safety, offer support, encouragement, and explain things at his pace and level.  Try to explain what lies ahead, expectations, etc.  R) Has been pleasant and cooperative, has become more open and willing to talk.  Remains safe on the unit and is currently resting after eating a salad with chicken.D

## 2012-07-31 NOTE — Clinical Social Work Psychosocial (Signed)
[facility] Group Notes:  (Clinical Social Work)  07/31/2012   11:15-11:45AM  Summary of Progress/Problems:  The main focus of today's process group was to listen to a variety of genres of music and to identify that different types of music provoke different responses.  The patient then was able to identify personally what was soothing for them, as well as energizing.  Examples were given of how to use this knowledge in sleep habits, with depression, and with other symptoms.  The patient expressed enjoyment of many types of music, and as CSW played different types, he expressed what each was making him feel, and what he was imagining as the music played.  Type of Therapy:  Music Therapy with processing done  Participation Level:  Active  Participation Quality:  Appropriate, Attentive and Sharing  Affect:  Appropriate  Cognitive:  Appropriate and Oriented  Insight:  Engaged  Engagement in Therapy:  Engaged  Modes of Intervention:   Socialization, Support and Processing, Exploration, Education, Rapport Building   [employer], LCSW 07/31/2012, 12:36 PM

## 2012-07-31 NOTE — Progress Notes (Signed)
Psychoeducational Group Note  Date:  07/31/2012 Time:  0945 am  Group Topic/Focus:  Making Healthy Choices:   The focus of this group is to help patients identify negative/unhealthy choices they were using prior to admission and identify positive/healthier coping strategies to replace them upon discharge.  Participation Level:  Minimal  Participation Quality:  Attentive  Affect:  Anxious  Cognitive:  Alert  Insight:  Improving  Engagement in Group:  Improving  Additional Comments:   Andrena Mews 07/31/2012, 10:29 AM

## 2012-08-01 MED ORDER — BENZTROPINE MESYLATE 1 MG PO TABS
1.0000 mg | ORAL_TABLET | Freq: Two times a day (BID) | ORAL | Status: DC
  Administered 2012-08-01 – 2012-08-09 (×16): 1 mg via ORAL
  Filled 2012-08-01 (×8): qty 1
  Filled 2012-08-01: qty 28
  Filled 2012-08-01 (×5): qty 1
  Filled 2012-08-01 (×2): qty 28
  Filled 2012-08-01 (×3): qty 1
  Filled 2012-08-01: qty 28
  Filled 2012-08-01 (×3): qty 1

## 2012-08-01 MED ORDER — ENSURE COMPLETE PO LIQD
237.0000 mL | Freq: Three times a day (TID) | ORAL | Status: DC
  Administered 2012-08-01 – 2012-08-09 (×21): 237 mL via ORAL

## 2012-08-01 MED ORDER — HALOPERIDOL 5 MG PO TABS
5.0000 mg | ORAL_TABLET | Freq: Two times a day (BID) | ORAL | Status: DC
  Administered 2012-08-01 – 2012-08-04 (×7): 5 mg via ORAL
  Filled 2012-08-01 (×10): qty 1

## 2012-08-01 NOTE — Clinical Social Work Note (Signed)
  Type of Therapy: Process Group Therapy  Participation Level:  Minimal  Participation Quality:  Attentive  Affect:  Blunted and Flat  Cognitive:  Disorganized and Confused  Insight:  Limited  Engagement in Group:  Engaged  Engagement in Therapy:  Engaged  Modes of Intervention:  Activity, Clarification, Education, Problem-solving and Support  Summary of Progress/Problems: Today's group addressed the issue of overcoming obstacles.  Patients were asked to identify their biggest obstacle post d/c that stands in the way of their on-going success, and then problem solve as to how to manage this. Gerald Ballard was focused on knowing when he would be leaving here and returning home.  He did speak of his challenge of getting back to work and school, but was unable to track enough to have further meaningful conversation about that.        [name] 08/01/2012   1:58 PM

## 2012-08-01 NOTE — Progress Notes (Signed)
The focus of this group is to help patients review their daily goal of treatment and discuss progress on daily workbooks. Pt attended the evening group session and responded to all prompts from the Writer, but appeared apprehensive and paranoid. Pt reported that he felt he was being held against his will, that he wasn't receiving any benefit from being here and that he wanted to know what was "really going on here." The Writer assured the Pt as to the nature of this hospital, that he would eventually be discharged and encouraged Pt to ask questions anytime he was unsure about something.

## 2012-08-01 NOTE — Progress Notes (Signed)
[facility] LCSW Aftercare Discharge Planning Group Note  08/01/2012 9:21 AM  Participation Quality:  Appropriate  Affect:  Appropriate  Cognitive:  Lacking  Insight:  Limited  Engagement in Group:  Engaged  Modes of Intervention:  Discussion, Exploration and Socialization  Summary of Progress/Problems:Bazil talked about how this experience is "an eye opener."  He states he is aware of the "phenomenon" that he is experiencing, and is familiarizing himself with it.  Plans to return to his apartment, but appears to be having some paranoid thoughts surrounding that. States he has relatives here, but could not be specific as to how they are related.  [name] 08/01/2012, 9:21 AM

## 2012-08-01 NOTE — Progress Notes (Signed)
Patient ID: Gerald Ballard, male   DOB: [birth year], 32 y.o.   MRN: [id number] D: Patient in bed sleeping. Respiration regular and unlabored. No sign of distress noted at this time A: 15 mins checks for  safety R: Patient remains asleep.

## 2012-08-01 NOTE — Progress Notes (Signed)
[facility] MD Progress Note  08/01/2012 9:22 AM Gerald Ballard  MRN:  [id number]  Subjective: Patient reports that he is still hearing voices , afraid of staying alone in his room for fear of "being cut opened". He is still acting paranoid, however his thinking is less disorganized. He is not sleeping well and has been reported by the staff that he is partially compliant with his medications.  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   HENT: Negative.   Eyes: Negative.   Respiratory: Negative.   Cardiovascular: Negative.   Gastrointestinal: Negative.   Genitourinary: Negative.   Musculoskeletal: Negative.   Skin: Negative.   Endo/Heme/Allergies: Negative.   Psychiatric/Behavioral: Positive for hallucinations. The patient is nervous/anxious and has insomnia.     Blood pressure 130/86, pulse 107, temperature 97.5 F (36.4 C), temperature source Oral, resp. rate 20, height 5\' 9"  (1.753 m), weight 51.71 kg (114 lb).Body mass index is 16.83 kg/(m^2).  General Appearance: Casual and Fairly Groomed  Patent attorney::  Fair  Speech:  Clear and Coherent  Volume:  Normal  Mood:  Dysphoric  Affect:  Blunt  Thought Process:  Disorganized  Orientation:  Full (Time, Place, and Person)  Thought Content:  Delusions, Hallucinations: Auditory and Paranoid Ideation  Suicidal Thoughts:  No  Homicidal Thoughts:  No  Memory:  Immediate;   Fair Recent;   Fair Remote;   Fair  Judgement:  Poor  Insight:  Lacking  Psychomotor Activity:  Restlessness  Concentration:  Poor  Recall:  Fair  Akathisia:  No  Handed:  Right  AIMS (if indicated):     Assets:  Physical Health  Sleep:  Number of Hours: 1    Current Medications: Current  Facility-Administered Medications  Medication Dose Route Frequency Provider Last Rate Last Dose  . acetaminophen (TYLENOL) tablet 650 mg  650 mg Oral Q6H PRN Sanjuana Kava, NP      . alum & mag hydroxide-simeth (MAALOX/MYLANTA) 200-200-20 MG/5ML suspension 30 mL  30 mL Oral Q4H PRN Sanjuana Kava, NP      . benztropine (COGENTIN) tablet 1 mg  1 mg Oral BID Lynleigh Kovack      . feeding supplement (ENSURE COMPLETE) liquid 237 mL  237 mL Oral TID WC Joselynne Killam      . haloperidol (HALDOL) tablet 5 mg  5 mg Oral BID Dovber Ernest      . lisinopril (PRINIVIL,ZESTRIL) tablet 10 mg  10 mg Oral Daily Anays Detore   10 mg at 08/01/12 0827  . LORazepam (ATIVAN) tablet 2 mg  2 mg Oral Q6H PRN Maud Rubendall   2 mg at 07/29/12 2121  . magnesium hydroxide (MILK OF MAGNESIA) suspension 30 mL  30 mL Oral Daily PRN Sanjuana Kava, NP      . mirtazapine (REMERON SOL-TAB) disintegrating tablet 15 mg  15 mg Oral QHS Zelie Asbill   15 mg at 07/31/12 0000    Lab Results: No results found for this or any previous visit (from the past 48 hour(s)).  Physical Findings: AIMS: Facial and Oral Movements Muscles of Facial Expression: None, normal Lips and Perioral Area: None, normal Jaw: None, normal Tongue: None, normal,Extremity Movements Upper (arms, wrists, hands, fingers): None, normal Lower (legs, knees, ankles, toes): None, normal, Trunk  Movements Neck, shoulders, hips: None, normal, Overall Severity Severity of abnormal movements (highest score from questions above): None, normal Incapacitation due to abnormal movements: None, normal Patient's awareness of abnormal movements (rate only patient's report): No Awareness, Dental Status Current problems with teeth and/or dentures?: No Does patient usually wear dentures?: No  CIWA:  CIWA-Ar Total: 0  COWS:  COWS Total Score: 2   Treatment Plan Summary: Daily contact with patient to assess and evaluate symptoms and progress in treatment Medication  management  Plan:  Medical Decision Making Problem Points:  Established problem, stable/improving (1), Review of last therapy session (1) and Review of psycho-social stressors (1) Data Points:  Order Aims Assessment (2) Review of medication regiment & side effects (2) Review of new medications or change in dosage (2)  I certify that inpatient services furnished can reasonably be expected to improve the patient's condition.   Thedore Mins, MD 08/01/2012, 9:22 AM

## 2012-08-01 NOTE — Progress Notes (Signed)
Psychoeducational Group Note  Date:  08/01/2012 Time:  1100  Group Topic/Focus:  Self Care:   The focus of this group is to help patients understand the importance of self-care in order to improve or restore emotional, physical, spiritual, interpersonal, and financial health.  Participation Level:  Active  Participation Quality:  Appropriate and Attentive  Affect:  Appropriate  Cognitive:  Appropriate  Insight:  Supportive  Engagement in Group:  Supportive  Additional Comments: Staff explained to the patient that this group is designed to assist in identifying activities that they will incorporate into their daily living with the intention of improving or restoring emotional, physical, spiritual, interpersonal and financial health. The patients were asked to identify one area or skill where they are taking care of themselves. Patients will identify 2-3 activities of self- care that they will use in their daily living after discharge. Patients were encouraged to utilize the skills and techniques taught and apply it in their daily routine.    Gerald Ballard 08/01/2012, 11:46 AM

## 2012-08-01 NOTE — Progress Notes (Signed)
D:Pt is guarded asking about going home. Pt reports that he plans to go back to his apartment when he leaves the hospital. Pt kept looking at his armband as he was talking to writer about being discharged.  A:Explained discharge process to pt. Encouraged pt to attend groups and offered 15 minute checks.  R:Pt denies si and hi. Pt denies hallucinations at this time. Safety maintained on the unit.

## 2012-08-01 NOTE — Progress Notes (Signed)
Pt denies SI/HI/AVH. Pt stated, why do people continue to ask me the same questions. Pt stated that he is fine and that he is ready to be d/c so that he can register for classes at [facility]. Pt stated that he would not drink the ensure earlier because it was pink but he will try another color later (chocolate). Pt has disorganized thoughts. Pt has difficulty staying on topic. Pt has no insight as to why he is here [facility]). Pt stated a man and woman brought him here [facility]) in a car. Pt reported that he do not know the man and woman. Pt appears to have some confusion during shift assessment.  Medications administered as ordered per MD. Verbal support given. Pt encouraged to attend groups. Pt encouraged to eat meals. 15 minute checks performed for safety.  Pt has been in dayroom watching tv this afternoon. Cooperative.

## 2012-08-02 NOTE — Progress Notes (Signed)
Patient ID: Gerald Ballard, male   DOB: [birth year], 32 y.o.   MRN: [id number] D: Pt is awake and active on the unit this PM. Pt denies SI/HI and A/V hallucinations. Pt is participating in the milieu and is cooperative with staff. Pt mood is appropriate/anxious and his affect is flat/depressed. Pt communicates well will staff and is appropriate in the milieu. Pt states that he takes classes at [employer] and works at [employer] and plans to return to his previous lifestyle. Pt also acknowledges the importance of continuing his medications after d/c. Pt states that he was unsure about taking pills at first but now plans to take them as prescribed. Pt seems to have some insight into his illness.   A: Writer utilized therapeutic communication, encouraged pt to discuss feelings with staff and administered medication per MD orders. Writer also encouraged pt to attend groups.  R: Pt is attending groups and tolerating medications well. Writer will continue to monitor. 15 minute checks are ongoing for safety.

## 2012-08-02 NOTE — Progress Notes (Signed)
Psychoeducational Group Note  Date:  08/02/2012 Time:  2000  Group Topic/Focus:  Wrap-Up Group:   The focus of this group is to help patients review their daily goal of treatment and discuss progress on daily workbooks.  Participation Level: Did Not Attend  Participation Quality:  Not Applicable  Affect:  Not Applicable  Cognitive:  Not Applicable  Insight:  Not Applicable  Engagement in Group: Not Applicable  Additional Comments:  Pt did not attend the evening session.  Christ Kick 08/02/2012, 9:28 PM

## 2012-08-02 NOTE — Progress Notes (Signed)
Patient ID: Gerald Ballard, male   DOB: [birth year], 32 y.o.   MRN: [id number] [facility] MD Progress Note  08/02/2012 10:52 AM Usiel Astarita  MRN:  [id number]  Subjective: Patient remains delusional, paranoid and hypervigilant. He continue to say he is afraid of being cut open and has not been fully compliant with his medication for fear of "being poisoned".  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   HENT: Negative.   Eyes: Negative.   Respiratory: Negative.   Cardiovascular: Negative.   Gastrointestinal: Negative.   Genitourinary: Negative.   Musculoskeletal: Negative.   Skin: Negative.   Endo/Heme/Allergies: Negative.   Psychiatric/Behavioral: Positive for hallucinations. The patient is nervous/anxious and has insomnia.     Blood pressure 113/78, pulse 94, temperature 98.5 F (36.9 C), temperature source Oral, resp. rate 15, height 5\' 9"  (1.753 m), weight 51.71 kg (114 lb).Body mass index is 16.83 kg/(m^2).  General Appearance: Casual and Fairly Groomed  Patent attorney::  Fair  Speech:  Clear and Coherent  Volume:  Normal  Mood:  Dysphoric  Affect:  Blunt  Thought Process:  Disorganized  Orientation:  Full (Time, Place, and Person)  Thought Content:  Delusions, Hallucinations: Auditory and Paranoid Ideation  Suicidal Thoughts:  No  Homicidal Thoughts:  No  Memory:  Immediate;   Fair Recent;   Fair Remote;   Fair  Judgement:  Poor  Insight:  Lacking  Psychomotor Activity:  Restlessness  Concentration:  Poor  Recall:  Fair  Akathisia:  No  Handed:  Right  AIMS (if indicated):     Assets:  Physical Health  Sleep:  Number of Hours: 6.75    Current Medications: Current Facility-Administered Medications  Medication Dose  Route Frequency Provider Last Rate Last Dose  . acetaminophen (TYLENOL) tablet 650 mg  650 mg Oral Q6H PRN Sanjuana Kava, NP      . alum & mag hydroxide-simeth (MAALOX/MYLANTA) 200-200-20 MG/5ML suspension 30 mL  30 mL Oral Q4H PRN Sanjuana Kava, NP      . benztropine (COGENTIN) tablet 1 mg  1 mg Oral BID Myracle Febres   1 mg at 08/02/12 0757  . feeding supplement (ENSURE COMPLETE) liquid 237 mL  237 mL Oral TID WC Vinod Mikesell   237 mL at 08/02/12 0758  . haloperidol (HALDOL) tablet 5 mg  5 mg Oral BID Sander Remedios   5 mg at 08/02/12 0757  . lisinopril (PRINIVIL,ZESTRIL) tablet 10 mg  10 mg Oral Daily Javeon Macmurray   10 mg at 08/02/12 0757  . LORazepam (ATIVAN) tablet 2 mg  2 mg Oral Q6H PRN Alegandra Sommers   2 mg at 07/29/12 2121  . magnesium hydroxide (MILK OF MAGNESIA) suspension 30 mL  30 mL Oral Daily PRN Sanjuana Kava, NP      . mirtazapine (REMERON SOL-TAB) disintegrating tablet 15 mg  15 mg Oral QHS Akiko Schexnider   15 mg at 08/01/12 2137    Lab Results: No results found for this or any previous visit (from the past 48 hour(s)).  Physical Findings: AIMS: Facial and Oral Movements Muscles of Facial Expression: None, normal Lips and Perioral Area: None, normal Jaw: None, normal Tongue: None, normal,Extremity Movements Upper (arms, wrists, hands, fingers): None, normal Lower (legs, knees, ankles, toes): None,  normal, Trunk Movements Neck, shoulders, hips: None, normal, Overall Severity Severity of abnormal movements (highest score from questions above): None, normal Incapacitation due to abnormal movements: None, normal Patient's awareness of abnormal movements (rate only patient's report): No Awareness, Dental Status Current problems with teeth and/or dentures?: No Does patient usually wear dentures?: No  CIWA:  CIWA-Ar Total: 0  COWS:  COWS Total Score: 2   Treatment Plan Summary: Daily contact with patient to assess and evaluate symptoms and progress in  treatment Medication management  Plan: 1. Patient to continue current medication regimen. 2.Patient encouraged to take his medication and attend group and other milieu  Medical Decision Making Problem Points:  Established problem, stable/improving (1), Review of last therapy session (1) and Review of psycho-social stressors (1) Data Points:  Order Aims Assessment (2) Review of medication regiment & side effects (2) Review of new medications or change in dosage (2)  I certify that inpatient services furnished can reasonably be expected to improve the patient's condition.   Thedore Mins, MD 08/02/2012, 10:52 AM

## 2012-08-02 NOTE — Progress Notes (Signed)
Psychoeducational Group Note  Date:  08/02/2012 Time:  930  Group Topic/Focus:  Recovery Goals:   The focus of this group is to identify appropriate goals for recovery and establish a plan to achieve them.  Participation Level:  Active  Participation Quality:  Appropriate and Attentive  Affect:  Appropriate  Cognitive:  Appropriate  Insight:  Engaged  Engagement in Group:  Engaged  Additional Comments:  Staff explained to the patient that the purpose of this group is to educate them on recovery, and on setting mid-range to long-term personal goals for their recovery process.  Staff also explained that the group will also address topics including what recovery is, who goes through recovery, the first steps toward recovery, and setting realistic goals for recovery. Patient was asked to identify two areas of their life in which they would like to make a change toward recovery, and set a specific measurable goal to address each change identified. Patient was provided with a homework assignment regarding how this goal impacts their personal recovery. Staff concluded the group by encouraging the patient to take a proactive approach to accomplishing their recovery goals.     Ardelle Park O 08/02/2012, 10:10 AM

## 2012-08-02 NOTE — Progress Notes (Signed)
Pt denies SI/HI/AVH. Pt denies pain and show no s/s of distress. Pt has been worrying about discharged throughout the shift. During am med pass, pt had to be redirected several times to take his meds. Pt was so fixated on being discharged and seeing the MD. Pt continues to be paranoid. Pt paranoid about leaving unit to go to cafeteria for meals. Pt continues to unacknowledged his relatives. Pt forwards little information during assessment. Pt focus is on being discharged and has no insight about treatment. Pt reported that he has not seen a doctor during his hospitalization. During session with MD, CM and writer, pt was informed again of who his treatment team is. Pt has disorganized thoughts.  Medications given as ordered per MD. Verbal support given. Pt encouraged to attend groups. 15 minute checks performed routinely for safety.   Paranoid. Pt stands in hallway w/intensive stare. Attended group. Compliant with meds this afternoon. Pt was able to go to cafeteria for dinner.

## 2012-08-02 NOTE — Progress Notes (Signed)
[facility] LCSW Aftercare Discharge Planning Group Note  08/02/2012 5:20 PM  Participation Quality:  Appropriate  Affect:  Blunted and Flat  Cognitive:  Alert and Confused  Insight:  None  Engagement in Group:  Engaged  Modes of Intervention:  Discussion, Exploration and Socialization  Summary of Progress/Problems:Quintel was focused only on d/c today.  He insisted he has been taking his meds and wanted to know exactly when he would get to see him.  Accused me of being self absorbed and looking out only for my interests. No insight.  Poor judgment.  [name] 08/02/2012, 5:20 PM

## 2012-08-02 NOTE — Treatment Plan (Signed)
Interdisciplinary Treatment Plan Update (Adult)  Date: 08/02/2012  Time Reviewed: 5:23 PM   Progress in Treatment: Attending groups: Yes Participating in groups: Yes Taking medication as prescribed: No Tolerating medication: Yes   Family/Significant other contact made: Yes  Patient understands diagnosis:  No Limited insight Discussing patient identified problems/goals with staff:  Yes See below Medical problems stabilized or resolved:  Yes Denies suicidal/homicidal ideation: Yes  In AM group Issues/concerns per patient self-inventory:  Not filled out Other:  New problem(s) identified: N/A  Reason for Continuation of Hospitalization: Hallucinations Medication stabilization  Interventions implemented related to continuation of hospitalization: Increase Haldol and encourage patient to take regularly  Encourage group attendance and participation  Additional comments:Pt was told clearly by Dr today that the way to get out of here is to take meds regularly.  Only alternative is [facility] state hospital.  Pt indicated he understood  Estimated length of stay: 3-5 days  Discharge Plan: return home, follow up outpt  New goal(s): N/A  Review of initial/current patient goals per problem list:   1.  Goal(s): Eliminate psychosis with the use of medication, therapeutic milieu  Met:  No  Target date:1/10  As evidenced by: Decrease paranoia, Decrease confusion  2.  Goal (s): Identify outpt provider  Met:  No  Target date:1/10  As evidenced [id number] of follow up appointment  3.  Goal(s):  Met:  No  Target date:  As evidenced by:  4.  Goal(s):  Met:  No  Target date:  As evidenced by:  Attendees: Patient:     Family:     Physician:  Thedore Mins 08/02/2012 5:23 PM   Nursing:  Liborio Nixon  08/02/2012 5:23 PM   Clinical Social Worker:  Richelle Ito 08/02/2012 5:23 PM   Extender:  Verne Spurr PA 08/02/2012 5:23 PM   Other:     Other:     Other:     Other:        Scribe for Treatment Team:   [name], 08/02/2012 5:23 PM

## 2012-08-03 NOTE — Clinical Social Work Note (Signed)
[facility] [employer] Group Therapy  08/03/2012 , 1:54 PM    Type of Therapy:  [employer] Presentation  Participation Level:  Active  Participation Quality:  Attentive  Affect:  Blunted  Cognitive:  Somewhat confused  Insight:  Limited  Engagement in Therapy:  Engaged  Modes of Intervention:  Discussion, Education and Socialization  Summary of Progress/Problems:  [name] from [employer] came to present his recovery story and play the guitar.  Stancil enjoyed the presentation and music very much.  He asked questions, many of them on track, some of them not so much.  [name] 08/03/2012 , 1:54 PM

## 2012-08-03 NOTE — Progress Notes (Signed)
D: Patient cooperative with staff. Patient's affect/mood is appropriate to circumstance, but sad at times. He reported on self inventory sheet that he slept well, appetite is good and energy level is normal. Patient rated depression and feelings of hopelessness "1".  A: Support and encouragement provided to patient. Administered scheduled medications per MD orders. Monitor Q15 minute checks for safety.  R: Patient receptive. Patient attend group. Denies SI/HI. Patient remains safe.

## 2012-08-03 NOTE — Progress Notes (Signed)
Psychoeducational Group Note  Date:  08/03/2012 Time:  1100  Group Topic/Focus:  Personal Choices and Values:   The focus of this group is to help patients assess and explore the importance of values in their lives, how their values affect their decisions, how they express their values and what opposes their expression.  Participation Level:  Active  Participation Quality:  Appropriate, Attentive and Sharing  Affect:  Appropriate  Cognitive:  Appropriate  Insight:  Engaged  Engagement in Group:  Engaged  Additional Comments: Gerald Ballard attended and participated during choices and values group. Patient stated what were some negative and positive values and choices that have been made within his life span. Patient was appropriate and shared when asked to speak.   [name] 08/03/2012, 12:02 PM

## 2012-08-03 NOTE — Progress Notes (Signed)
Patient ID: Gerald Ballard, male   DOB: Aug 02, 1980, 32 y.o.   MRN: [id number] [facility] MD Progress Note  08/03/2012 9:09 AM Gerald Ballard  MRN:  [id number]  Subjective: Patient is now sleeping better, less paranoid but remains  hypervigilant. He is partially compliant with his medications and attends groups occasionally. He has not reporedt any adverse reactions to his medications.  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   HENT: Negative.   Eyes: Negative.   Respiratory: Negative.   Cardiovascular: Negative.   Gastrointestinal: Negative.   Genitourinary: Negative.   Musculoskeletal: Negative.   Skin: Negative.   Endo/Heme/Allergies: Negative.   Psychiatric/Behavioral: The patient is nervous/anxious.        Paranoid    Blood pressure 113/70, pulse 105, temperature 98 F (36.7 C), temperature source Oral, resp. rate 16, height 5\' 9"  (1.753 m), weight 51.71 kg (114 lb).Body mass index is 16.83 kg/(m^2).  General Appearance: Casual and Fairly Groomed  Patent attorney::  Fair  Speech:  Clear and Coherent  Volume:  Normal  Mood:  Dysphoric  Affect:  Blunt  Thought Process:  Disorganized  Orientation:  Full (Time, Place, and Person)  Thought Content:  Delusions, Hallucinations: Auditory and Paranoid Ideation  Suicidal Thoughts:  No  Homicidal Thoughts:  No  Memory:  Immediate;   Fair Recent;   Fair Remote;   Fair  Judgement:  Poor  Insight:  Lacking  Psychomotor Activity:  Restlessness  Concentration:  Poor  Recall:  Fair  Akathisia:  No  Handed:  Right  AIMS (if indicated):     Assets:  Physical Health  Sleep:  Number of Hours: 6.75    Current Medications: Current Facility-Administered Medications  Medication Dose Route  Frequency Provider Last Rate Last Dose  . acetaminophen (TYLENOL) tablet 650 mg  650 mg Oral Q6H PRN Sanjuana Kava, NP      . alum & mag hydroxide-simeth (MAALOX/MYLANTA) 200-200-20 MG/5ML suspension 30 mL  30 mL Oral Q4H PRN Sanjuana Kava, NP      . benztropine (COGENTIN) tablet 1 mg  1 mg Oral BID Neka Bise   1 mg at 08/03/12 0818  . feeding supplement (ENSURE COMPLETE) liquid 237 mL  237 mL Oral TID WC Dannica Bickham   237 mL at 08/03/12 0639  . haloperidol (HALDOL) tablet 5 mg  5 mg Oral BID Saba Gomm   5 mg at 08/03/12 0818  . lisinopril (PRINIVIL,ZESTRIL) tablet 10 mg  10 mg Oral Daily Haskell Rihn   10 mg at 08/03/12 0818  . LORazepam (ATIVAN) tablet 2 mg  2 mg Oral Q6H PRN Falen Lehrmann   2 mg at 07/29/12 2121  . magnesium hydroxide (MILK OF MAGNESIA) suspension 30 mL  30 mL Oral Daily PRN Sanjuana Kava, NP      . mirtazapine (REMERON SOL-TAB) disintegrating tablet 15 mg  15 mg Oral QHS Yahsir Wickens   15 mg at 08/02/12 2147    Lab Results: No results found for this or any previous visit (from the past 48 hour(s)).  Physical Findings: AIMS: Facial and Oral Movements Muscles of Facial Expression: None, normal Lips and Perioral Area: None, normal Jaw: None, normal Tongue: None, normal,Extremity Movements Upper (arms, wrists, hands, fingers): None, normal Lower (legs, knees, ankles,  toes): None, normal, Trunk Movements Neck, shoulders, hips: None, normal, Overall Severity Severity of abnormal movements (highest score from questions above): None, normal Incapacitation due to abnormal movements: None, normal Patient's awareness of abnormal movements (rate only patient's report): No Awareness, Dental Status Current problems with teeth and/or dentures?: No Does patient usually wear dentures?: No  CIWA:  CIWA-Ar Total: 0  COWS:  COWS Total Score: 2   Treatment Plan Summary: Daily contact with patient to assess and evaluate symptoms and progress in  treatment Medication management  Plan: 1. Patient to continue current medication regimen. 2.Patient encouraged to take his medication and attend group and other milieu  Medical Decision Making Problem Points:  Established problem, stable/improving (1), Review of last therapy session (1) and Review of psycho-social stressors (1) Data Points:  Order Aims Assessment (2) Review of medication regiment & side effects (2) Review of new medications or change in dosage (2)  I certify that inpatient services furnished can reasonably be expected to improve the patient's condition.   Thedore Mins, MD 08/03/2012, 9:09 AM

## 2012-08-03 NOTE — Progress Notes (Signed)
Patient ID: Gerald Ballard, male   DOB: 27-Jun-1981, 32 y.o.   MRN: [id number] D: Pt is awake and active on the unit this AM. Pt denies SI/HI and A/V hallucinations. Pt is participating in the milieu and is cooperative with staff. Pt rates their depression at 1 and their hopelessness at 1. Pt mood is depressed and his affect is flat. Pt's mood is withdrawn/depressed and his affect is flat/ apprehensive. Pt is polite and oriented. Pt forwards little to staff but he does answer questions and is med compliant as well. Pt writes that he will "get more interactive and also get to my set goals." He also plans to follow up with his Child psychotherapist and MD regarding his future progress. Pt has some confusion and needs redirection and encouragement to go to the gym for recreation group. Pt seems to have difficulty making decisions.   A: Writer utilized therapeutic communication, encouraged pt to discuss feelings with staff and administered medication per MD orders. Writer also encouraged pt to attend groups.  R: Pt is attending groups and tolerating medications well. Writer will continue to monitor. 15 minute checks are ongoing for safety.

## 2012-08-03 NOTE — Progress Notes (Signed)
D: Pt in bed resting with eyes closed. Respirations even and unlabored. Pt appears to be in no signs of distress at this time. A: Q15min checks remains for this pt. R: Pt remains safe at this time.   

## 2012-08-04 MED ORDER — FLUPHENAZINE HCL 5 MG PO TABS
5.0000 mg | ORAL_TABLET | Freq: Two times a day (BID) | ORAL | Status: DC
  Administered 2012-08-04 – 2012-08-08 (×8): 5 mg via ORAL
  Filled 2012-08-04 (×10): qty 1

## 2012-08-04 MED ORDER — MIRTAZAPINE 30 MG PO TBDP
30.0000 mg | ORAL_TABLET | Freq: Every day | ORAL | Status: DC
  Administered 2012-08-04 – 2012-08-08 (×5): 30 mg via ORAL
  Filled 2012-08-04 (×5): qty 1
  Filled 2012-08-04: qty 14
  Filled 2012-08-04: qty 1
  Filled 2012-08-04: qty 14

## 2012-08-04 NOTE — Progress Notes (Signed)
Patient ID: Gerald Ballard, male   DOB: [birth year], 32 y.o.   MRN: [id number] [facility] MD Progress Note  08/04/2012 9:54 AM Serafin Decatur  MRN:  [id number]  Subjective: Patient verbalizes depressive symptoms, low energy level, lack of motivation, poor concentration and feeling hopeless about his situations. He remains paranoid and hypervigilant. He continue to talk about the devils and associates everything including one of his medications Haldol with the "devil and hell fire". He is partially compliant with his medications and attends groups occasionally. He has not repored any adverse reactions to his medications.  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   HENT: Negative.   Eyes: Negative.   Respiratory: Negative.   Cardiovascular: Negative.   Gastrointestinal: Negative.   Genitourinary: Negative.   Musculoskeletal: Negative.   Skin: Negative.   Endo/Heme/Allergies: Negative.   Psychiatric/Behavioral: Positive for hallucinations. The patient is nervous/anxious.        Paranoid delusions.    Blood pressure 127/83, pulse 102, temperature 98.3 F (36.8 C), temperature source Oral, resp. rate 16, height 5\' 9"  (1.753 m), weight 51.71 kg (114 lb).Body mass index is 16.83 kg/(m^2).  General Appearance: Casual and Fairly Groomed  Patent attorney::  Fair  Speech:  Clear and Coherent  Volume:  Normal  Mood:  Dysphoric  Affect:  Blunt  Thought Process:  Disorganized  Orientation:  Full (Time, Place, and Person)  Thought Content:  Delusions, Hallucinations: Auditory and Paranoid Ideation  Suicidal Thoughts:  No  Homicidal Thoughts:  No  Memory:  Immediate;   Fair Recent;   Fair Remote;   Fair  Judgement:  Poor  Insight:  Lacking    Psychomotor Activity:  Restlessness  Concentration:  Poor  Recall:  Fair  Akathisia:  No  Handed:  Right  AIMS (if indicated):     Assets:  Physical Health  Sleep:  Number of Hours: 5.25    Current Medications: Current Facility-Administered Medications  Medication Dose Route Frequency Provider Last Rate Last Dose  . acetaminophen (TYLENOL) tablet 650 mg  650 mg Oral Q6H PRN Sanjuana Kava, NP      . alum & mag hydroxide-simeth (MAALOX/MYLANTA) 200-200-20 MG/5ML suspension 30 mL  30 mL Oral Q4H PRN Sanjuana Kava, NP      . benztropine (COGENTIN) tablet 1 mg  1 mg Oral BID Danilo Cappiello   1 mg at 08/04/12 0832  . feeding supplement (ENSURE COMPLETE) liquid 237 mL  237 mL Oral TID WC Marilyn Nihiser   237 mL at 08/03/12 1719  . fluPHENAZine (PROLIXIN) tablet 5 mg  5 mg Oral BID PC Ashtin Melichar      . lisinopril (PRINIVIL,ZESTRIL) tablet 10 mg  10 mg Oral Daily Emori Kamau   10 mg at 08/04/12 0832  . LORazepam (ATIVAN) tablet 2 mg  2 mg Oral Q6H PRN Jarelly Rinck   2 mg at 07/29/12 2121  . magnesium hydroxide (MILK OF MAGNESIA) suspension 30 mL  30 mL Oral Daily PRN Sanjuana Kava, NP      . mirtazapine (REMERON SOL-TAB) disintegrating tablet 30 mg  30 mg Oral QHS Amika Tassin        Lab Results: No results found for this or any previous visit (from the past 48 hour(s)).  Physical Findings: AIMS:  Facial and Oral Movements Muscles of Facial Expression: None, normal Lips and Perioral Area: None, normal Jaw: None, normal Tongue: None, normal,Extremity Movements Upper (arms, wrists, hands, fingers): None, normal Lower (legs, knees, ankles, toes): None, normal, Trunk Movements Neck, shoulders, hips: None, normal, Overall Severity Severity of abnormal movements (highest score from questions above): None, normal Incapacitation due to abnormal movements: None, normal Patient's awareness of abnormal movements (rate only patient's report): No Awareness, Dental Status Current  problems with teeth and/or dentures?: No Does patient usually wear dentures?: No  CIWA:  CIWA-Ar Total: 0  COWS:  COWS Total Score: 2   Treatment Plan Summary: Daily contact with patient to assess and evaluate symptoms and progress in treatment Medication management  Plan: 1. Will change his medication from Haldol to Prolixin 5mg  po twice daily for delusions and psychosis 2. Will increase Remeron to 30mg  daily at bedtime for depression/Insomnia. 3.Patient encouraged to take his medication and attend group and other milieu  Medical Decision Making Problem Points:  Established problem, stable/improving (1), Review of last therapy session (1) and Review of psycho-social stressors (1) Data Points:  Order Aims Assessment (2) Review of medication regiment & side effects (2) Review of new medications or change in dosage (2)  I certify that inpatient services furnished can reasonably be expected to improve the patient's condition.   Thedore Mins, MD 08/04/2012, 9:54 AM

## 2012-08-04 NOTE — Progress Notes (Signed)
Patient ID: Gerald Ballard, male   DOB: July 18, 1981, 32 y.o.   MRN: [id number]  D: Patient in dayroom on approach. Pt mood and affect was anxious. Pt asked about possible discharge tomorrow and new medications prescribed. Pt denies SI/HI/AV and pain. Cooperative with assessment. No acute distressed noted.   A: Met with pt 1:1. Medications administered as prescribed. Writer encouraged pt to discuss feelings. Writer explained to pt the importance of taking his medications once discharged. Pt express feeling a lot better in his thinking but not sure where he is going to live. Pt encouraged to come to staff with any question or concerns. Safety has been maintained with Q15 minutes observation.   R: Patient remains safe. He is complaint with medications. Safety has been maintained Q15 and continue current POC.

## 2012-08-04 NOTE — Progress Notes (Signed)
Psychoeducational Group Note  Date:  08/04/2012 Time:  0930  Group Topic/Focus:  Building Self Esteem:   The Focus of this group is helping patients become aware of the effects of self-esteem on their lives, the things they and others do that enhance or undermine their self-esteem, seeing the relationship between their level of self-esteem and the choices they make and learning ways to enhance self-esteem.  Participation Level:  Active  Participation Quality:  Appropriate, Attentive and Sharing  Affect:  Appropriate  Cognitive:  Appropriate  Insight:  Engaged  Engagement in Group:  Engaged  Additional Comments:  Gerald Ballard attended and was appropriate during group. Patient stated one thing he like about himself and how he could improve his self esteem. During group it was discussed what low and self esteem was and how patient defined self-esteem in own terms.   Gerald Ballard 08/04/2012, 11:06 AM

## 2012-08-04 NOTE — Progress Notes (Signed)
D: Patient pleasant and cooperative with staff. Patient's affect/mood is sad. He reported on self inventory sheet that he's sleeping well, appetite is good, energy level is normal and ability to pay attention is good. Patient attended group.  A: Support and encouragement provided to patient. Scheduled medications administered per ordering MD. Monitor Q15 minute checks for safety.  R: Patient receptive. Denies SI/HI. Patient remains safe.

## 2012-08-04 NOTE — Progress Notes (Signed)
Patient ID: Gerald Ballard, male   DOB: 07-22-1981, 32 y.o.   MRN: [id number] D: No new data from previous assessment. Patient in bed sleeping. Respiration regular and unlabored. No sign of distress noted at this time A: 15 mins checks for  Safety. R: Patient remains asleep. Pt is safe.

## 2012-08-04 NOTE — Progress Notes (Signed)
[facility] LCSW Aftercare Discharge Planning Group Note  08/04/2012 11:59 AM  Participation Quality:  Attentive  Affect:  Blunted  Cognitive:  Paranoid  Insight:  Limited  Engagement in Group:  Engaged  Modes of Intervention:  Discussion, Exploration and Socialization  Summary of Progress/Problems:Gerald Ballard stated he is suspicious of taking haldol because it sounds like "Hell-dol".  He stated his main support is God, and when pushed to name a support other than that, he names "humans" and then "someone I know."  Is singularly focused on d/c tomorrow.  Gerald Ballard 08/04/2012, 11:59 AM

## 2012-08-05 NOTE — Progress Notes (Signed)
D: Patient appropriate and cooperative with staff. Patient's affect/mood is sad and anxious. He reported on self inventory sheet that his sleep is fair, appetite is good, energy level is normal and ability to pay attention is good. Patient rated depression and feelings of hopelessness "0".  A: Support and encouragement provided to patient. Administered scheduled medications per ordering MD. Monitor Q15 minute checks for safety.  R: Patient receptive. Denies SI/HI/AVH. Patient remains safe on the unit.

## 2012-08-05 NOTE — Progress Notes (Signed)
Nutrition Brief Note  Pt meets criteria for moderate MALNUTRITION in the context of chronic illness as evidenced by pt with visible mild/moderate wasting and subcutaneous fat loss in shoulders, clavicles, arms, and hands.  Patient identified on the Malnutrition Screening Tool (MST) Report  Body mass index is 16.83 kg/(m^2). Patient meets criteria for underweight based on current BMI.   Met with pt who appears thin and is underweight. Pt tended to ramble on with psychotic thoughts about hell and babies but was able to state that he does not follow a set schedule for his meals at home. Pt unsure of any changes in weight. Pt reports some days he does not eat breakfast, other days he eats more often, typically 2 meals/day. Pt reports liking the Ensure drink - will order. Pt without any further nutrition questions concerns.   Levon Hedger MS, RD, LDN [phone] Pager [phone] After Hours Pager

## 2012-08-05 NOTE — Progress Notes (Signed)
Patient ID: Gerald Ballard, male   DOB: February 08, 1981, 32 y.o.   MRN: [id number] D: Pt presented with depressed mood and flat affect. Pt stated he "called president Obama and asked for a plane tickets to go back home to see how his parents are doing and another ticket to guarantee he can be allowed back into the country". Pt is religiously preoccuopied with numbers and stated every one is in codes. Pt took his armband off and stated he is no longer in boundage and he is free. Pt attended evening wrap up group and interacted appropriately with peers. Pt denies SI/HI/AV and pain. Cooperative with assessment. No acute distressed noted.   A: Met with pt 1:1. Medications administered as prescribed. Writer encouraged pt to discuss feelings. Pt encouraged to come to staff with any question or concerns. Q15 minutes observation for safety.   R: Patient remains safe. He is complaint with medications and group programming. Continue current POC.

## 2012-08-05 NOTE — Treatment Plan (Signed)
Interdisciplinary Treatment Plan Update (Adult)  Date: 08/05/2012  Time Reviewed: 10:56 AM   Progress in Treatment: Attending groups: Yes Participating in groups: Yes Taking medication as prescribed: Yes Tolerating medication: Yes   Family/Significant other contact made:   Patient understands diagnosis:  Yes Discussing patient identified problems/goals with staff:  Yes Medical problems stabilized or resolved:  Yes Denies suicidal/homicidal ideation: Yes In tx team Issues/concerns per patient self-inventory:  Not filled out Other:  New problem(s) identified: N/A  Reason for Continuation of Hospitalization: Hallucinations Medication stabilization  Interventions implemented related to continuation of hospitalization: Changed medication from Haldol to Prolixin yesterday due to pts reluctance to take haldol Encourage group attendance and participation  Additional comments:  Estimated length of stay: 4-5 days  Discharge Plan: unknown  New goal(s): N/A  Review of initial/current patient goals per problem list:   1.  Goal(s): Eliminate psychosis  Met:  No  Target date:1/15  As evidenced [id number] paranoia, decrease confusion, decrease delusions  2.  Goal (s):Identify outpt provider  Met:  No  Target date:1/15  As evidenced [id number] on where he lives post d/c  3.  Goal(s):Identify dispositional plan  Met:  No  Target date:1/15  As evidenced [id number] is not able to return to his apartment.  Will need to identify alternative situation when he is thinking clearly enough to be be able to talk about this  4.  Goal(s):  Met:  No  Target date:  As evidenced by:  Attendees: Patient:     Family:     Physician:  Thedore Mins 08/05/2012 10:56 AM   Nursing:    08/05/2012 10:56 AM   Clinical Social Worker:  Richelle Ito 08/05/2012 10:56 AM   Extender:  Verne Spurr PA 08/05/2012 10:56 AM   Other:     Other:     Other:     Other:      Scribe for Treatment Team:    [name], 08/05/2012 10:56 AM

## 2012-08-05 NOTE — Progress Notes (Signed)
Psychoeducational Group Note  Date:  08/05/2012 Time:  2000  Group Topic/Focus:  Wrap-Up Group:   The focus of this group is to help patients review their daily goal of treatment and discuss progress on daily workbooks.  Participation Level:  Active  Participation Quality:  Appropriate  Affect:  Appropriate  Cognitive:  Appropriate  Insight:  Engaged  Engagement in Group:  Engaged  Additional Comments:  Pt attended wrap-up group this evening. Pt stated that he would like to go back home to [place] to visit his parents.   Toniyah Dilmore A 08/05/2012, 10:39 PM

## 2012-08-05 NOTE — Clinical Social Work Note (Signed)
[facility] LCSW Group Therapy  08/05/2012 2:56 PM   Type of Therapy:  Group Therapy  Participation Level:  Active  Participation Quality:  Attentive  Affect:  Appropriate  Cognitive:  Disorganized  Insight:  None  Engagement in Therapy:  Engaged  Modes of Intervention:  Clarification, Education, Exploration and Socialization  Summary of Progress/Problems: Today's group focused on relapse prevention.  We defined the term, and then brainstormed on ways to prevent relapse. Dionis was involved, but was having a parallel conversation in another universe.  I was disconnected from what were saying.  At one point he asked me if I was running for political office.  I asked why he thought that, and he said he heard another patient talking about it.  Of course, she denied it, and he insisted it to be true.  [name] 08/05/2012 , 2:56 PM

## 2012-08-05 NOTE — Progress Notes (Signed)
Patient ID: Gerald Ballard, male   DOB: Dec 10, 1980, 32 y.o.   MRN: [id number] [facility] MD Progress Note  08/05/2012 9:58 AM Mcgregor Tinnon  MRN:  [id number]  Subjective: Patient remains delusional, religiously preoccupied talking about his concerned about certain numbers in the bible #666 and "#13" or "#699" as a marked of the devil. He is saying that the devil is taking over the world and he is not willing to participates in that. His thought process is bizarre and disorganized. He is partially compliant with his medications and attends groups occasionally. He has not repored any adverse reactions to his medications.  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   HENT: Negative.   Eyes: Negative.   Respiratory: Negative.   Cardiovascular: Negative.   Gastrointestinal: Negative.   Genitourinary: Negative.   Musculoskeletal: Negative.   Skin: Negative.   Endo/Heme/Allergies: Negative.   Psychiatric/Behavioral: Positive for hallucinations. The patient is nervous/anxious.        Bizarre and delusions.    Blood pressure 118/78, pulse 114, temperature 98.3 F (36.8 C), temperature source Oral, resp. rate 16, height 5\' 9"  (1.753 m), weight 51.71 kg (114 lb).Body mass index is 16.83 kg/(m^2).  General Appearance: Casual and Fairly Groomed  Patent attorney::  Fair  Speech:  Clear and Coherent  Volume:  Normal  Mood:  Dysphoric  Affect:  Blunt  Thought Process:  Disorganized  Orientation:  Full (Time, Place, and Person)  Thought Content:  Delusions, Hallucinations: Auditory and Paranoid Ideation  Suicidal Thoughts:  No  Homicidal Thoughts:  No  Memory:  Immediate;   Fair Recent;   Fair Remote;   Fair  Judgement:  Poor  Insight:  Lacking    Psychomotor Activity:  Restlessness  Concentration:  Poor  Recall:  Fair  Akathisia:  No  Handed:  Right  AIMS (if indicated):     Assets:  Physical Health  Sleep:  Number of Hours: 6    Current Medications: Current Facility-Administered Medications  Medication Dose Route Frequency Provider Last Rate Last Dose  . acetaminophen (TYLENOL) tablet 650 mg  650 mg Oral Q6H PRN Sanjuana Kava, NP      . alum & mag hydroxide-simeth (MAALOX/MYLANTA) 200-200-20 MG/5ML suspension 30 mL  30 mL Oral Q4H PRN Sanjuana Kava, NP      . benztropine (COGENTIN) tablet 1 mg  1 mg Oral BID Chigozie Basaldua   1 mg at 08/05/12 0802  . feeding supplement (ENSURE COMPLETE) liquid 237 mL  237 mL Oral TID WC Bekim Werntz   237 mL at 08/05/12 0800  . fluPHENAZine (PROLIXIN) tablet 5 mg  5 mg Oral BID PC [employer]   5 mg at 08/05/12 0802  . lisinopril (PRINIVIL,ZESTRIL) tablet 10 mg  10 mg Oral Daily Peg Fifer   10 mg at 08/05/12 0801  . LORazepam (ATIVAN) tablet 2 mg  2 mg Oral Q6H PRN Kell Ferris   2 mg at 07/29/12 2121  . magnesium hydroxide (MILK OF MAGNESIA) suspension 30 mL  30 mL Oral Daily PRN Sanjuana Kava, NP      . mirtazapine (REMERON SOL-TAB) disintegrating tablet 30 mg  30 mg Oral QHS Kristol Almanzar   30 mg at 08/04/12 2135    Lab Results: No results found  for this or any previous visit (from the past 48 hour(s)).  Physical Findings: AIMS: Facial and Oral Movements Muscles of Facial Expression: None, normal Lips and Perioral Area: None, normal Jaw: None, normal Tongue: None, normal,Extremity Movements Upper (arms, wrists, hands, fingers): None, normal Lower (legs, knees, ankles, toes): None, normal, Trunk Movements Neck, shoulders, hips: None, normal, Overall Severity Severity of abnormal movements (highest score from questions above): None, normal Incapacitation due to abnormal movements: None, normal Patient's awareness of abnormal movements (rate only patient's report): No  Awareness, Dental Status Current problems with teeth and/or dentures?: No Does patient usually wear dentures?: No  CIWA:  CIWA-Ar Total: 0  COWS:  COWS Total Score: 2   Treatment Plan Summary: Daily contact with patient to assess and evaluate symptoms and progress in treatment Medication management  Plan: 1. Will continue Prolixin 5mg   po twice daily for delusions and psychosis 2. Will continue Remeron  30mg  daily at bedtime for depression/Insomnia. 3. Patient encouraged to take his medication and attend group and other milieu  Medical Decision Making Problem Points:  Established problem, stable/improving (1), Review of last therapy session (1) and Review of psycho-social stressors (1) Data Points:  Order Aims Assessment (2) Review of medication regiment & side effects (2) Review of new medications or change in dosage (2)  I certify that inpatient services furnished can reasonably be expected to improve the patient's condition.   Thedore Mins, MD 08/05/2012, 9:58 AM

## 2012-08-05 NOTE — Progress Notes (Signed)
Psychoeducational Group Note  Date:  08/05/2012 Time:  0930  Group Topic/Focus:  Therapeutic Activity  Participation Level: Did Not Attend  Participation Quality:  Not Applicable  Affect:  Not Applicable  Cognitive:  Not Applicable  Insight:  Not Applicable  Engagement in Group: Not Applicable  Additional Comments: Patient did not attend group, patient remained in bed.  [name] 08/05/2012, 11:40 AM

## 2012-08-05 NOTE — Progress Notes (Signed)
[facility] LCSW Aftercare Discharge Planning Group Note  08/05/2012 2:51 PM  Participation Quality:  Attentive  Affect:  Appropriate  Cognitive:  Disorganized  Insight:  None  Engagement in Group:  Engaged  Modes of Intervention:  Discussion, Exploration and Socialization  Summary of Progress/Problems:Gerald Ballard was singularly focused on discharge.  He was religiously preoccupied and sang a song for [place].  It was not in [place].  [name] 08/05/2012, 2:51 PM

## 2012-08-06 NOTE — Progress Notes (Signed)
Psychoeducational Group Note  Date:  08/06/2012 Time:  2000  Group Topic/Focus:  Wrap-Up Group:   The focus of this group is to help patients review their daily goal of treatment and discuss progress on daily workbooks.  Participation Level:  Active  Participation Quality:  Appropriate  Affect:  Appropriate  Cognitive:  Appropriate  Insight:  Engaged  Engagement in Group:  Engaged  Additional Comments:  Patient attended and participated in group tonight. He reports having a good day. His family visited, he attended group, and took his medication, although he does not like the medication because it makes him sleepy.   [name] 08/06/2012, 10:18 PM

## 2012-08-06 NOTE — Progress Notes (Signed)
D: Pt denies SI/HI/AV. Pt is pleasant and cooperative. Pt is delusional and very religiously preoccupied. Pt also does not wear id band because he stated it makes him feel like he is in bondage. Therefore pt keeps id band in his pocket.  A: Pt was offered support and encouragement. Pt was given scheduled medications. Pt was encourage to attend groups. Q 15 minute checks were done for safety.  R:Pt attends groups and interacts well with peers and staff. Pt  taking medication. Pt has no complaints.Pt receptive to treatment and safety maintained on unit.

## 2012-08-06 NOTE — Progress Notes (Signed)
[facility] Group Notes:  (Counselor/Nursing/MHT/Case Management/Adjunct)  08/06/2012 10:45 AM  Type of Therapy:  self inventory  Participation Level:  Active  Participation Quality:  Sharing  Affect:  Appropriate  Cognitive:  Appropriate  Insight:  Improving  Engagement in Group:  Engaged  Engagement in Therapy:  Developing/Improving  Modes of Intervention:  Discussion  Summary of Progress/Problems:   Gerald Ballard 08/06/2012, 10:45 AM

## 2012-08-06 NOTE — Clinical Social Work Psychosocial (Signed)
[facility] Group Notes:  (Clinical Social Work)  08/06/2012  11:15-11:45AM  Summary of Progress/Problems:   The main focus of today's process group was for the patient to identify ways in which they have in the past sabotaged their own recovery and reasons they may have done this/what they received from doing it.  We then worked to identify a specific plan to avoid doing this when discharged from the hospital for this admission.  The patient expressed that he will return to his apartment only if the problems have been sorted out.  When asked, he said the problems are two beings there in the apartment with him, one which travels back in time and one which travels forward in time.  He gave vivid but calm descriptions of these and other thoughts which did not appear to be based in reality.  He was also able to explain to the group how diabetes works, in a Teacher, music.  Type of Therapy:  Group Therapy - Process  Participation Level:  Active  Participation Quality:  Monopolizing  Affect:  Blunted  Cognitive:  Delusional  Insight:  Limited  Engagement in Therapy:  Developing/Improving  Modes of Intervention:  Clarification, Education, Limit-setting, Problem-solving, Socialization, Support and Processing, Exploration, Discussion   Ambrose Mantle, LCSW 08/06/2012, 1:05 PM

## 2012-08-06 NOTE — Progress Notes (Signed)
Patient ID: Gerald Ballard, male   DOB: [birth year], 32 y.o.   MRN: [id number] [facility] MD Progress Note  08/06/2012 4:51 PM Gerald Ballard  MRN:  [id number]  Subjective: Patient remains delusional, stated he called Obama to ask for a plane ticket home. Notes indicate that he reports two people who live with him that travel back and forth in time. 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  Psychiatric Specialty Exam: Review of Systems  Constitutional: Negative.  Negative for fever, chills, weight loss, malaise/fatigue and diaphoresis.  HENT: Negative for congestion and sore throat.   Eyes: Negative for blurred vision, double vision and photophobia.  Respiratory: Negative for cough, shortness of breath and wheezing.   Cardiovascular: Negative for chest pain, palpitations and PND.  Gastrointestinal: Negative for heartburn, nausea, vomiting, abdominal pain, diarrhea and constipation.  Musculoskeletal: Negative for myalgias, joint pain and falls.  Neurological: Negative for dizziness, tingling, tremors, sensory change, speech change, focal weakness, seizures, loss of consciousness, weakness and headaches.  Endo/Heme/Allergies: Negative for polydipsia. Does not bruise/bleed easily.  Psychiatric/Behavioral: Negative for depression, suicidal ideas, hallucinations, memory loss and substance abuse. The patient is not nervous/anxious and does not have insomnia.     Blood pressure 119/80, pulse 98, temperature 98.2 F (36.8 C), temperature source Oral, resp. rate 18, height 5\' 9"  (1.753 m), weight 51.71 kg (114 lb).Body mass index is 16.83 kg/(m^2).  General Appearance: Casual and Fairly Groomed resting in bed  Eye Contact::  Fair  Speech:  Clear and Coherent  Volume:  Normal    Mood:  Dysphoric  Affect:  Blunt  Thought Process:  Disorganized  Orientation:  Full (Time, Place, and Person)  Thought Content:  Delusions, Hallucinations: Auditory and Paranoid Ideation  Suicidal Thoughts:  No  Homicidal Thoughts:  No  Memory:  Immediate;   Fair Recent;   Fair Remote;   Fair  Judgement:  Poor  Insight:  Lacking  Psychomotor Activity:  Restlessness  Concentration:  Poor  Recall:  Fair  Akathisia:  No  Handed:  Right  AIMS (if indicated):     Assets:  Physical Health  Sleep:  Number of Hours: 6.75    Current Medications: Current Facility-Administered Medications  Medication Dose Route Frequency Provider Last Rate Last Dose  . acetaminophen (TYLENOL) tablet 650 mg  650 mg Oral Q6H PRN Sanjuana Kava, NP      . alum & mag hydroxide-simeth (MAALOX/MYLANTA) 200-200-20 MG/5ML suspension 30 mL  30 mL Oral Q4H PRN Sanjuana Kava, NP      . benztropine (COGENTIN) tablet 1 mg  1 mg Oral BID Mojeed Akintayo   1 mg at 08/06/12 0814  . feeding supplement (ENSURE COMPLETE) liquid 237 mL  237 mL Oral TID WC Mojeed Akintayo   237 mL at 08/06/12 0624  . fluPHENAZine (PROLIXIN) tablet 5 mg  5 mg Oral BID PC Mojeed Akintayo   5 mg at 08/06/12 0814  . lisinopril (PRINIVIL,ZESTRIL) tablet 10 mg  10 mg Oral Daily Mojeed Akintayo   10 mg at 08/06/12 0814  . LORazepam (ATIVAN) tablet 2 mg  2 mg Oral Q6H PRN Mojeed Akintayo   2 mg at 07/29/12 2121  . magnesium hydroxide (MILK OF MAGNESIA) suspension 30 mL  30 mL Oral Daily PRN Sanjuana Kava, NP      .  mirtazapine (REMERON SOL-TAB) disintegrating tablet 30 mg  30 mg Oral QHS Mojeed Akintayo   30 mg at 08/05/12 2118    Lab Results: No results found for this or any previous visit (from the past 48 hour(s)).  Physical Findings: AIMS: Facial and Oral Movements Muscles of Facial Expression: None, normal Lips and Perioral Area: None, normal Jaw: None, normal Tongue: None, normal,Extremity Movements Upper (arms, wrists, hands, fingers):  None, normal Lower (legs, knees, ankles, toes): None, normal, Trunk Movements Neck, shoulders, hips: None, normal, Overall Severity Severity of abnormal movements (highest score from questions above): None, normal Incapacitation due to abnormal movements: None, normal Patient's awareness of abnormal movements (rate only patient's report): No Awareness, Dental Status Current problems with teeth and/or dentures?: No Does patient usually wear dentures?: No  CIWA:  CIWA-Ar Total: 0  COWS:  COWS Total Score: 2   Treatment Plan Summary: Daily contact with patient to assess and evaluate symptoms and progress in treatment Medication management  Plan: 1. Will continue Prolixin 5mg   po twice daily for delusions and psychosis 2. Will continue Remeron  30mg  daily at bedtime for depression/Insomnia. 3. Patient encouraged to take his medication and attend group and other milieu  Medical Decision Making Problem Points:  Established problem, stable/improving (1), Review of last therapy session (1) and Review of psycho-social stressors (1) Data Points:  Review or order medicine tests (1)  I certify that inpatient services furnished can reasonably be expected to improve the patient's condition.  Rona Ravens. Seraphim Trow PAC 08/06/2012, 4:51 PM

## 2012-08-07 NOTE — Clinical Social Work Psychosocial (Signed)
[facility] Group Notes:  (Clinical Social Work)  08/07/2012   9:30-10AM  Summary of Progress/Problems:  The main focus of today's process group was to listen to a variety of genres of music and to identify that different types of music provoke different responses.  The patient then was able to identify personally what was soothing for them, as well as energizing.  Examples were given of how to use this knowledge in sleep habits, with depression, and with other symptoms.  The patient expressed continually what each type of music made him think of, did not seem to understand the CSW requests to identify the feeling instead of the thought.  He was continually talking, and had to be told many times to allow others to focus on the music.  Type of Therapy:  Music Therapy with processing done  Participation Level:  Active  Participation Quality:  Intrusive and Monopolizing  Affect:  Blunted  Cognitive:  Disorganized  Insight:  Distracting  Engagement in Therapy:  Distracting  Modes of Intervention:   Socialization, Support and Processing, Exploration, Education, Rapport Building   [employer], LCSW 08/07/2012, 11:52 AM

## 2012-08-07 NOTE — Progress Notes (Signed)
Patient ID: Gerald Ballard, male   DOB: 13-Jan-1981, 32 y.o.   MRN: [id number] [facility] MD Progress Note  08/06/2012 4:51 PM Gerald Ballard  MRN:  [id number]  Subjective: Patient remains delusional, still focused on numbers and their religious meanings. Also focused on his "name day" which means Way, and his name which also means way. His name day is "yao" and way together sound like "yaway".  He feels he is ready to go home. 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  Psychiatric Specialty Exam: Review of Systems  Constitutional: Negative.  Negative for fever, chills, weight loss, malaise/fatigue and diaphoresis.  HENT: Negative for congestion and sore throat.   Eyes: Negative for blurred vision, double vision and photophobia.  Respiratory: Negative for cough, shortness of breath and wheezing.   Cardiovascular: Negative for chest pain, palpitations and PND.  Gastrointestinal: Negative for heartburn, nausea, vomiting, abdominal pain, diarrhea and constipation.  Musculoskeletal: Negative for myalgias, joint pain and falls.  Neurological: Negative for dizziness, tingling, tremors, sensory change, speech change, focal weakness, seizures, loss of consciousness, weakness and headaches.  Endo/Heme/Allergies: Negative for polydipsia. Does not bruise/bleed easily.  Psychiatric/Behavioral: Negative for depression, suicidal ideas, hallucinations, memory loss and substance abuse. The patient is not nervous/anxious and does not have insomnia.     Blood pressure 119/80, pulse 98, temperature 98.2 F (36.8 C), temperature source Oral, resp. rate 18, height 5\' 9"  (1.753 m), weight 51.71 kg (114 lb).Body mass index is 16.83 kg/(m^2).  General Appearance: Casual and Fairly Groomed resting  in bed  Eye Contact::  Fair  Speech:  Clear and Coherent  Volume:  Normal  Mood:  Dysphoric  Affect:  Blunt  Thought Process:  Disorganized  Religiously preoccupied and focused  Orientation:  Full (Time, Place, and Person)  Thought Content:  Delusions, Hallucinations: Auditory and Paranoid Ideation, ruminates about numbers and is fixated on 13 and 6.  Suicidal Thoughts:  No  Homicidal Thoughts:  No  Memory:  Immediate;   Fair Recent;   Fair Remote;   Fair  Judgement:  Poor  Insight:  Lacking  Psychomotor Activity:  Restlessness  Concentration:  Poor  Recall:  Fair  Akathisia:  No  Handed:  Right  AIMS (if indicated):     Assets:  Physical Health  Sleep:  Number of Hours: 6.75    Current Medications: Current Facility-Administered Medications  Medication Dose Route Frequency Provider Last Rate Last Dose  . acetaminophen (TYLENOL) tablet 650 mg  650 mg Oral Q6H PRN Sanjuana Kava, NP      . alum & mag hydroxide-simeth (MAALOX/MYLANTA) 200-200-20 MG/5ML suspension 30 mL  30 mL Oral Q4H PRN Sanjuana Kava, NP      . benztropine (COGENTIN) tablet 1 mg  1 mg Oral BID Mojeed Akintayo   1 mg at 08/06/12 0814  . feeding supplement (ENSURE COMPLETE) liquid 237 mL  237 mL Oral TID WC Mojeed Akintayo   237 mL at 08/06/12 0624  . fluPHENAZine (PROLIXIN) tablet 5 mg  5 mg Oral BID PC Mojeed Akintayo   5 mg at 08/06/12 0814  . lisinopril (PRINIVIL,ZESTRIL) tablet 10 mg  10 mg Oral Daily Mojeed Akintayo   10 mg at 08/06/12 0814  . LORazepam (ATIVAN) tablet 2 mg  2 mg Oral Q6H PRN Mojeed Akintayo  2 mg at 07/29/12 2121  . magnesium hydroxide (MILK OF MAGNESIA) suspension 30 mL  30 mL Oral Daily PRN Sanjuana Kava, NP      . mirtazapine (REMERON SOL-TAB) disintegrating tablet 30 mg  30 mg Oral QHS Mojeed Akintayo   30 mg at 08/05/12 2118    Lab Results: No results found for this or any previous visit (from the past 48 hour(s)).  Physical Findings: AIMS: Facial and Oral Movements Muscles of  Facial Expression: None, normal Lips and Perioral Area: None, normal Jaw: None, normal Tongue: None, normal,Extremity Movements Upper (arms, wrists, hands, fingers): None, normal Lower (legs, knees, ankles, toes): None, normal, Trunk Movements Neck, shoulders, hips: None, normal, Overall Severity Severity of abnormal movements (highest score from questions above): None, normal Incapacitation due to abnormal movements: None, normal Patient's awareness of abnormal movements (rate only patient's report): No Awareness, Dental Status Current problems with teeth and/or dentures?: No Does patient usually wear dentures?: No  CIWA:  CIWA-Ar Total: 0  COWS:  COWS Total Score: 2   Treatment Plan Summary: Daily contact with patient to assess and evaluate symptoms and progress in treatment Medication management  Plan: 1. Will continue Prolixin 5mg   po twice daily for delusions and psychosis 2. Will continue Remeron  30mg  daily at bedtime for depression/Insomnia. 3. Patient encouraged to take his medication and attend group and other milieu  Medical Decision Making Problem Points:  Established problem, stable/improving (1), Review of last therapy session (1) and Review of psycho-social stressors (1) Data Points:  Review or order medicine tests (1)  I certify that inpatient services furnished can reasonably be expected to improve the patient's condition.  Rona Ravens. Erandi Lemma PAC 08/06/2012, 4:51 PM

## 2012-08-07 NOTE — Progress Notes (Signed)
Psychoeducational Group Note  Date:  08/07/2012 Time:  0945 am  Group Topic/Focus:  Making Healthy Choices:   The focus of this group is to help patients identify negative/unhealthy choices they were using prior to admission and identify positive/healthier coping strategies to replace them upon discharge.  Participation Level:  Active  Participation Quality:  Appropriate  Affect:  Appropriate  Cognitive:  Alert  Insight:  Engaged  Engagement in Group:  Engaged  Additional Comments:    Andrena Mews 08/07/2012, 10:29 AM

## 2012-08-07 NOTE — Progress Notes (Signed)
D: Patient denies SI/HI/AVH.   Patient affect is flat. Mood is appropriate.  Pt is delusional and is religiously preoccupied.  When I asked pt how things have been today he stated "I've just been reading this (bible).  This is the greatest gift that I have ever got."  When asked in group about his support systems, he stated "The Shaune Pollack is all that I need."  At one point I went into the patients room and he was tearful as he read his bible.  He stated "I am crying tears of joy."  Patient did attend evening group. Patient visible on the milieu. No distress noted. A: Support and encouragement offered. Scheduled medications given to pt. Q 15 min checks continued for patient safety. R: Patient receptive. Patient remains safe on the unit.

## 2012-08-07 NOTE — Progress Notes (Signed)
Patient ID: Gerald Ballard, male   DOB: 12-03-1980, 32 y.o.   MRN: [id number]   D:Pt observed sleeping in bed with eyes closed. RR even and unlabored. No distress noted  A: 15 min checks  for safety   R: pt remains safe

## 2012-08-07 NOTE — Progress Notes (Signed)
D: Pt denies SI/HI/AV. Pt is pleasant and cooperative. Pt is delusion and religiously preoccupied.    A: Pt was offered support and encouragement. Pt was given scheduled medications. Pt was encourage to attend groups. Q 15 minute checks were done for safety.  R:Pt attends groups and interacts well with peers and staff. Pt  taking medication. Pt has no complaints.Pt receptive to treatment and safety maintained on unit.

## 2012-08-08 MED ORDER — FLUPHENAZINE HCL 5 MG PO TABS
10.0000 mg | ORAL_TABLET | Freq: Two times a day (BID) | ORAL | Status: DC
  Administered 2012-08-08 – 2012-08-09 (×2): 10 mg via ORAL
  Filled 2012-08-08 (×2): qty 56
  Filled 2012-08-08: qty 1
  Filled 2012-08-08: qty 56
  Filled 2012-08-08 (×2): qty 1
  Filled 2012-08-08: qty 56
  Filled 2012-08-08: qty 1

## 2012-08-08 NOTE — Progress Notes (Signed)
Pt refused to answer questions during shift assessment. Pt stated "do you think the doctor is God or something". Pt then tried to reverse the questions to Clinical research associate. Pt has be manipulative, pt called his uncle this am to pick him up from [facility]. Pt uncle was informed that the pt would not be discharged today. Pt refused to go to group this morning because he wanted to wait at the nurses station window until he saw the doctor. Pt was uncooperative, staff tried to redirect pt but pt refused to go to room or group. Pt continues to be religiously paranoid/preoccupied, referring to the bible and God about regulations, his rights and how he is being held against his rights.  Medications administered as ordered per MD. Verbal support given. Pt encouraged to attend groups. 15 minute checks  Performed for safety.  Paranoid thoughts. Manipulating between staff. Isolative in room.

## 2012-08-08 NOTE — Progress Notes (Signed)
The focus of this group is to help patients review their daily goal of treatment and discuss progress on daily workbooks. Pt attended the evening group and responded to discussion prompts, though each answer involved his being released to fly home to see his family and not the topic being discussed. Pt said that he had to fly home to [place] to show his family in person that he was physically and mentally all right.

## 2012-08-08 NOTE — Progress Notes (Signed)
Psychoeducational Group Note  Date:  08/08/2012 Time:  2000  Group Topic/Focus:  Wrap-Up Group:   The focus of this group is to help patients review their daily goal of treatment and discuss progress on daily workbooks.  Participation Level:  Active  Participation Quality:  Appropriate  Affect:  Appropriate  Cognitive:  Delusional  Insight:  Improving  Engagement in Group:  Engaged  Additional Comments:  Patient attended and participated in group tonight. He reports having a good day. He stated everything good came to him. He attended his meals, his group sessions, socialized and read the bible. He advise that his support system was God.  Lita Mains [facility] 08/08/2012, 1:10 AM

## 2012-08-08 NOTE — Progress Notes (Signed)
Patient ID: Gerald Ballard, male   DOB: 02/08/1981, 32 y.o.   MRN: [id number] [facility] MD Progress Note  08/08/2012 10:53 AM Della Homan  MRN:  [id number]  Subjective: Patient remains delusional and religiously preoccupied. He thinks that people are against him because he has refused to follow the rules and regulation on the unit. He states that he will only belief and follow Bible. Patient has become more argumentative and his thought process remains bizarre and disorganized. He is  compliant with his medications and attends groups occasionally. He has not repored any adverse reactions to his medications.  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   HENT: Negative.   Eyes: Negative.   Respiratory: Negative.   Cardiovascular: Negative.   Gastrointestinal: Negative.   Genitourinary: Negative.   Musculoskeletal: Negative.   Skin: Negative.   Endo/Heme/Allergies: Negative.   Psychiatric/Behavioral: Positive for hallucinations.       Bizarre and delusions.    Blood pressure 124/87, pulse 98, temperature 97.8 F (36.6 C), temperature source Oral, resp. rate 20, height 5\' 9"  (1.753 m), weight 51.71 kg (114 lb).Body mass index is 16.83 kg/(m^2).  General Appearance: Casual and Fairly Groomed  Patent attorney::  Fair  Speech:  Clear and Coherent  Volume:  Normal  Mood:  Dysphoric  Affect:  Blunt  Thought Process:  Disorganized  Orientation:  Full (Time, Place, and Person)  Thought Content:  Delusions, Hallucinations: Auditory and Paranoid Ideation  Suicidal Thoughts:  No  Homicidal Thoughts:  No  Memory:  Immediate;   Fair Recent;   Fair Remote;   Fair  Judgement:  Poor  Insight:  Lacking  Psychomotor Activity:  Restlessness    Concentration:  Poor  Recall:  Fair  Akathisia:  No  Handed:  Right  AIMS (if indicated):     Assets:  Physical Health  Sleep:  Number of Hours: 6.75    Current Medications: Current Facility-Administered Medications  Medication Dose Route Frequency Provider Last Rate Last Dose  . acetaminophen (TYLENOL) tablet 650 mg  650 mg Oral Q6H PRN Sanjuana Kava, NP      . alum & mag hydroxide-simeth (MAALOX/MYLANTA) 200-200-20 MG/5ML suspension 30 mL  30 mL Oral Q4H PRN Sanjuana Kava, NP      . benztropine (COGENTIN) tablet 1 mg  1 mg Oral BID Tammara Massing   1 mg at 08/08/12 0810  . feeding supplement (ENSURE COMPLETE) liquid 237 mL  237 mL Oral TID WC Ivaan Liddy   237 mL at 08/08/12 0635  . fluPHENAZine (PROLIXIN) tablet 10 mg  10 mg Oral BID PC Everette Dimauro      . lisinopril (PRINIVIL,ZESTRIL) tablet 10 mg  10 mg Oral Daily Saharsh Sterling   10 mg at 08/08/12 0810  . LORazepam (ATIVAN) tablet 2 mg  2 mg Oral Q6H PRN Dalyn Becker   2 mg at 07/29/12 2121  . magnesium hydroxide (MILK OF MAGNESIA) suspension 30 mL  30 mL Oral Daily PRN Sanjuana Kava, NP      . mirtazapine (REMERON SOL-TAB) disintegrating tablet 30 mg  30 mg Oral QHS Abisai Deer   30 mg at 08/07/12 2131    Lab Results: No results found for this or any previous visit (from the past 48 hour(s)).  Physical Findings: AIMS: Facial and Oral Movements Muscles of Facial Expression: None, normal Lips and Perioral Area: None, normal Jaw: None, normal Tongue: None, normal,Extremity Movements Upper (arms, wrists, hands, fingers): None, normal Lower (legs, knees, ankles, toes): None, normal, Trunk Movements Neck, shoulders, hips: None, normal, Overall Severity Severity of abnormal movements (highest score from questions above): None, normal Incapacitation due to abnormal movements: None, normal Patient's awareness of abnormal movements (rate only patient's report): No Awareness, Dental Status Current problems with teeth  and/or dentures?: No Does patient usually wear dentures?: No  CIWA:  CIWA-Ar Total: 0  COWS:  COWS Total Score: 2   Treatment Plan Summary: Daily contact with patient to assess and evaluate symptoms and progress in treatment Medication management  Plan: 1. Will increase Prolixin to 10mg   po twice daily for delusions and psychosis 2. Will continue Remeron  30mg  daily at bedtime for depression/Insomnia. 3. Patient encouraged to take his medication and attend group and other milieu  Medical Decision Making Problem Points:  Established problem, stable/improving (1), Review of last therapy session (1) and Review of psycho-social stressors (1) Data Points:  Order Aims Assessment (2) Review of medication regiment & side effects (2) Review of new medications or change in dosage (2)  I certify that inpatient services furnished can reasonably be expected to improve the patient's condition.   Thedore Mins, MD 08/08/2012, 10:53 AM

## 2012-08-08 NOTE — Progress Notes (Signed)
[facility] LCSW Aftercare Discharge Planning Group Note  08/08/2012 5:33 PM  Participation Quality:  Refused to attend group.  Was standing in hall with Bible in hand looking through window at Dr.  Charline Bills Dr is going to release him today.    [name] 08/08/2012, 5:33 PM

## 2012-08-08 NOTE — Clinical Social Work Note (Signed)
  Type of Therapy: Process Group Therapy  Participation Level:  Did Not Attend    Modes of Intervention:  Activity, Clarification, Education, Problem-solving and Support  Summary of Progress/Problems: Today's group addressed the issue of overcoming obstacles.  Patients were asked to identify their biggest obstacle post d/c that stands in the way of their on-going success, and then problem solve as to how to manage this.       [name] 08/08/2012   5:35 PM

## 2012-08-09 MED ORDER — MIRTAZAPINE 30 MG PO TBDP: 30.0000 mg | ORAL_TABLET | Freq: Every day | ORAL | Status: DC

## 2012-08-09 MED ORDER — FLUPHENAZINE HCL 10 MG PO TABS: 10.0000 mg | ORAL_TABLET | Freq: Two times a day (BID) | ORAL | Status: DC

## 2012-08-09 MED ORDER — LISINOPRIL 10 MG PO TABS: 10.0000 mg | ORAL_TABLET | Freq: Every day | ORAL | Status: DC

## 2012-08-09 MED ORDER — BENZTROPINE MESYLATE 1 MG PO TABS: 1.0000 mg | ORAL_TABLET | Freq: Two times a day (BID) | ORAL | Status: DC

## 2012-08-09 MED ORDER — LORAZEPAM 2 MG PO TABS: 2.0000 mg | ORAL_TABLET | Freq: Four times a day (QID) | ORAL | Status: DC | PRN

## 2012-08-09 NOTE — Progress Notes (Signed)
Adult Psychoeducational Group Note  Date:  08/09/2012 Time:  10:03 AM  Group Topic/Focus:  Recovery Goals:   The focus of this group is to identify appropriate goals for recovery and establish a plan to achieve them.  Participation Level:  Active  Participation Quality:  Appropriate, Attentive and Sharing  Affect:  Appropriate  Cognitive:  Alert and Appropriate  Insight: Appropriate  Engagement in Group:  Engaged  Modes of Intervention:  Discussion  Additional Comments:  Pt was appropriate and attentive while attending group. Pt shared that he plans to check on his parents and to never come to this facility again.   Sharyn Lull 08/09/2012, 10:03 AM

## 2012-08-09 NOTE — Progress Notes (Signed)
Patient ID: Gerald Ballard, male   DOB: 1981/05/22, 32 y.o.   MRN: [id number]  D: Pt stated, "today has been interesting". Stated, "he needs to go see his family and get them a bigger mansion." Pt requested a round trip to [place]. Pt immediately began talking about his meds and informed the writer that he would not be taking his sleep meds. "I want to go to sleep naturally.  A:Support and encouragement was offered. 15 min checks continued for safety.  R: Pt remains safe.

## 2012-08-09 NOTE — Progress Notes (Signed)
[facility] Adult Case Management Discharge Plan :  Will you be returning to the same living situation after discharge: No. At discharge, do you have transportation home?:Yes,  [employer]. Do you have the ability to pay for your medications:Yes,  mental health  Release of information consent forms completed and in the chart;  Patient's signature needed at discharge.  Patient to Follow up at: Follow-up Information    Follow up with [facility]. (Go to their walk-in Monday-Friday between 8 and 9AM for your hospital follow up appointment.  This is where you will see a psychiatrist for your medication.)    Contact information:   [address]  [place]  [phone]         Patient denies SI/HI:   Yes,  yes    Safety Planning and Suicide Prevention discussed:  Yes,  yes  [name] 08/09/2012, 10:18 AM

## 2012-08-09 NOTE — [facility] Suicide Risk Assessment (Signed)
Suicide Risk Assessment  Discharge Assessment     Demographic Factors:  Low socioeconomic status, Living alone, Unemployed and NA  Mental Status Per Nursing Assessment::   On Admission:  NA  Current Mental Status by Physician: patient denies suicidal ideation, intent or plan  Loss Factors: Financial problems/change in socioeconomic status  Historical Factors: NA  Risk Reduction Factors:   Religious beliefs about death  Continued Clinical Symptoms:  resolving delusions  Cognitive Features That Contribute To Risk:  Closed-mindedness Polarized thinking    Suicide Risk:  Minimal: No identifiable suicidal ideation.  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 No pertinent past medical history    AXIS IV:  economic problems, other psychosocial or environmental problems and problems related to social environment AXIS V:  61-70 mild symptoms  Plan Of Care/Follow-up recommendations:  Activity:  as tolerated Diet:  healthy Tests:  routine Other:  patient to keep his after care appointment  Is patient on multiple antipsychotic therapies at discharge:  No   Has Patient had three or more failed trials of antipsychotic monotherapy by history:  No  Recommended Plan for Multiple Antipsychotic Therapies: N/A   Thedore Mins, MD 08/09/2012, 10:02 AM

## 2012-08-09 NOTE — Progress Notes (Signed)
Patient ID: Gerald Ballard, male   DOB: 03-06-1981, 32 y.o.   MRN:  Discharged note: Pt denies SI/HI/AVH. Pt received both written and verbal discharged instructions. Pt agreed to f/u appointment and medication regimen. Pt received all belongings from room and left via taxi.  Pt received sample meds, prescriptions and a taxi voucher. Pt cooperative during process.

## 2012-08-12 NOTE — Progress Notes (Signed)
Patient Discharge Instructions:  After Visit Summary (AVS):   Faxed to:  08/12/12 Psychiatric Admission Assessment Note:   Faxed to:  08/12/12 Suicide Risk Assessment - Discharge Assessment:   Faxed to:  08/12/12 Faxed/Sent to the Next Level Care provider:  08/12/12 Faxed to [facility] @ [phone]  Jerelene Redden, 08/12/2012, 3:19 PM

## 2012-08-18 NOTE — Discharge Summary (Signed)
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  No pertinent past medical history    AXIS IV: economic problems, other psychosocial or environmental problems and problems related to social environment  AXIS V: 61-70 mild symptoms Review of Systems  Constitutional: Negative.  Negative for fever, chills, weight loss, malaise/fatigue and diaphoresis.  HENT: Negative for congestion and sore throat.   Eyes: Negative for blurred vision, double vision and photophobia.  Respiratory: Negative for cough, shortness of breath and wheezing.   Cardiovascular: Negative for chest pain, palpitations and PND.  Gastrointestinal: Negative for heartburn, nausea, vomiting, abdominal pain, diarrhea and constipation.  Musculoskeletal: Negative for myalgias, joint pain and falls.  Neurological: Negative for dizziness, tingling, tremors, sensory change, speech change, focal weakness, seizures, loss of consciousness, weakness and headaches.  Endo/Heme/Allergies: Negative for polydipsia. Does not bruise/bleed easily.  Psychiatric/Behavioral: Negative for depression, suicidal ideas, hallucinations, memory loss and substance abuse. The patient is not nervous/anxious and does not have insomnia.     Level of Care:  OP  Hospital Course:  Michoel was admitted after being brought to the ED by his aunt for increasingly bizarre and unusual behaviors.  She stated  that he had moved 4 times recently and could not remember the address where he lived. Raden also believed he could help a man in the movie "[employer] [place]" about a man who went into the hospital and never came out.  He was given medical clearance and transferred to [facility] for further stabilization and treatment.  For his delusional symptoms Jalan was started on Prolixin and titrated to a dose of 10mg .  For side effects he was given cogentin 1mg . For his hypertension he was given Zestril 10mg  po qd, and for sleep he was given Remeron 30mg  dissolving tablet.  For anxiety and agitation he was given ativan 2mg .    He was evaluated each day by a clinical provider and his response to treatment was assessed.  Progress was noted by his verbal reports of reduced symptoms, improved sleep, improved appetite, and written response on daily self assessments. He tolerated the medication well and voiced no side effects.  As his symptoms resolved he had improved significantly and was in much improved condition than upon arrival. He denied SI/HI and voiced no AVH. He no longer met the criteria for further in patient hospitalization and was discharged.  Consults:  None  Significant Diagnostic Studies:  labs: please see labs associated with this visit via EMR  Discharge Vitals:   Blood pressure 124/84, pulse 102, temperature 98.2 F (36.8 C), temperature source Oral, resp. rate 20, height 5\' 9"  (1.753 m), weight 51.71 kg (114 lb). Body mass index is 16.83 kg/(m^2). Lab Results:   No results found for this or any previous visit (from the past 72 hour(s)).  Physical Findings: AIMS: Facial and Oral Movements Muscles of Facial Expression: None, normal Lips and Perioral Area: None, normal Jaw: None, normal Tongue: None, normal,Extremity Movements  Upper (arms, wrists, hands, fingers): None, normal Lower (legs, knees, ankles, toes): None, normal, Trunk Movements Neck, shoulders, hips: None, normal, Overall  Severity Severity of abnormal movements (highest score from questions above): None, normal Incapacitation due to abnormal movements: None, normal Patient's awareness of abnormal movements (rate only patient's report): No Awareness, Dental Status Current problems with teeth and/or dentures?: No Does patient usually wear dentures?: No  CIWA:  CIWA-Ar Total: 0  COWS:  COWS Total Score: 2   Psychiatric Specialty Exam: See Psychiatric Specialty Exam and Suicide Risk Assessment completed by Attending Physician prior to discharge.  Discharge destination:  Home  Is patient on multiple antipsychotic therapies at discharge:  No   Has Patient had three or more failed trials of antipsychotic monotherapy by history:  No  Recommended Plan for Multiple Antipsychotic Therapies: NA   Discharge Orders    Future Orders Please Complete By Expires   Diet - low sodium heart healthy      Increase activity slowly      Discharge instructions      Comments:   Take all your medications as prescribed by your mental healthcare provider. Report any adverse effects and or reactions from your medicines to your outpatient provider promptly. Patient is instructed and cautioned to not engage in alcohol and or illegal drug use while on prescription medicines. In the event of worsening symptoms, patient is instructed to call the crisis hotline, 911 and or go to the nearest ED for appropriate evaluation and treatment of symptoms. Follow-up with your primary care provider for your other medical issues, concerns and or health care needs.       Medication List     As of 08/18/2012  3:16 PM    TAKE these medications      Indication    benztropine 1 MG tablet   Commonly known as: COGENTIN   Take 1 tablet (1 mg total) by mouth 2 (two) times daily. For side effects.    Indication: Extrapyramidal Reaction caused by Medications      fluPHENAZine 10 MG tablet   Commonly known as: PROLIXIN   Take 1 tablet (10 mg total)  by mouth 2 (two) times daily after a meal. For clarity of thought and psychosis.    Indication: Psychosis      lisinopril 10 MG tablet   Commonly known as: PRINIVIL,ZESTRIL   Take 1 tablet (10 mg total) by mouth daily. For hypertension.    Indication: High Blood Pressure      LORazepam 2 MG tablet   Commonly known as: ATIVAN   Take 1 tablet (2 mg total) by mouth every 6 (six) hours as needed for anxiety (agitation).    Indication: Feeling Anxious      mirtazapine 30 MG disintegrating tablet   Commonly known as: REMERON SOL-TAB   Take 1 tablet (30 mg total) by mouth at bedtime. For insomnia.    Indication: Trouble Sleeping           Follow-up Information    Follow up with [facility]. (Go to their walk-in Monday-Friday between 8 and 9AM for your hospital follow up appointment.  This is where you will see a psychiatrist for your medication.)    Contact information:   [address]  [place]  [phone]         Follow-up recommendations:  As noted above  Comments:    Total Discharge Time:  >30 minutes  Signed: Lloyd Huger T. Krissy Orebaugh PAC 08/18/2012, 3:16 PM

## 2012-08-22 NOTE — Discharge Summary (Signed)
Seen and agreed. Caleel Kiner, MD 

## 2013-04-28 ENCOUNTER — Encounter (HOSPITAL_COMMUNITY): Payer: Self-pay | Admitting: *Deleted

## 2013-04-28 ENCOUNTER — Emergency Department (HOSPITAL_COMMUNITY)
Admission: EM | Admit: 2013-04-28 | Discharge: 2013-04-29 | Disposition: A | Discharge status: Home / Self Care | Payer: Self-pay | Attending: Emergency Medicine | Admitting: Emergency Medicine

## 2013-04-28 DIAGNOSIS — Z79899 Other long term (current) drug therapy: Secondary | ICD-10-CM | POA: Insufficient documentation

## 2013-04-28 DIAGNOSIS — R51 Headache: Secondary | ICD-10-CM | POA: Insufficient documentation

## 2013-04-28 DIAGNOSIS — I499 Cardiac arrhythmia, unspecified: Secondary | ICD-10-CM | POA: Insufficient documentation

## 2013-04-28 DIAGNOSIS — F411 Generalized anxiety disorder: Secondary | ICD-10-CM | POA: Insufficient documentation

## 2013-04-28 NOTE — ED Provider Notes (Signed)
CSN: [id number]     Arrival date & time 04/28/13  2106 History   First MD Initiated Contact with Patient 04/28/13 2212     Chief Complaint  Patient presents with  . Headache  . Irregular Heart Beat   (Consider location/radiation/quality/duration/timing/severity/associated sxs/prior Treatment) HPI Comments: 32 yo male presents to the ER for cc of mild HA and anxiety which began 1 month ago.  Pt stays with a friend who recommended coming to the ER.  Pt states, "He just didn't feel right".  He denies fever recently.  No cough, CP, or n/v/d, no f/u/d.  No recent antibiotics.  Pt does endorse mild SOB which is rare.   A: M:Cogentin (not taking), Prolixin (not takig), Lisinopril (not taking), Ativan, (not taking), Remeron (not taking. PMH: Remote h/o malaria PCM: [place]    Travel History: Pt took a trip to [place] one months ago.  Arrived in [place] in August and returned to [place] in late September (appox 5 days ago).  Pt denies exposure to any family members or friends with fever, URI symptoms, n/v/d, or pain.  Pt flew with [employer]:   [phone] Enedina Finner.  ID spoke with him at 10:45 PM.  Patient is a 32 y.o. male presenting with headaches. The history is provided by the patient. The history is limited by a language barrier.  Headache   Past Medical History  Diagnosis Date  . No pertinent past medical history   . Mental disorder    Past Surgical History  Procedure Laterality Date  . No past surgeries     Family History  Problem Relation Age of Onset  . Diabetes Father    History  Substance Use Topics  . Smoking status: Never Smoker   . Smokeless tobacco: Not on file  . Alcohol Use: Yes     Comment: wine occasionally    Review of Systems  Neurological: Positive for headaches.    Allergies  Pork-derived products  Home Medications   Current Outpatient Rx  Name  Route  Sig  Dispense  Refill  . benztropine (COGENTIN) 1 MG tablet   Oral   Take 1 tablet (1  mg total) by mouth 2 (two) times daily. For side effects.   60 tablet   0   . fluPHENAZine (PROLIXIN) 10 MG tablet   Oral   Take 1 tablet (10 mg total) by mouth 2 (two) times daily after a meal. For clarity of thought and psychosis.   60 tablet   0   . lisinopril (PRINIVIL,ZESTRIL) 10 MG tablet   Oral   Take 1 tablet (10 mg total) by mouth daily. For hypertension.   30 tablet   0   . LORazepam (ATIVAN) 2 MG tablet   Oral   Take 1 tablet (2 mg total) by mouth every 6 (six) hours as needed for anxiety (agitation).   15 tablet   0   . mirtazapine (REMERON SOL-TAB) 30 MG disintegrating tablet   Oral   Take 1 tablet (30 mg total) by mouth at bedtime. For insomnia.   30 tablet   0    BP 143/88  Pulse 87  Temp(Src) 98.5 F (36.9 C) (Oral)  Resp 20  SpO2 99% Physical Exam  Constitutional: He is oriented to person, place, and time. He appears well-developed and well-nourished.  HENT:  Head: Normocephalic and atraumatic.  Right Ear: External ear normal.  Left Ear: External ear normal.  Mouth/Throat: No oropharyngeal exudate.  Eyes: Conjunctivae are normal.  Pupils are equal, round, and reactive to light.  Neck: Normal range of motion. Neck supple. No JVD present. No tracheal deviation present. No thyromegaly present.  No meningismus, FAROM, soft, supple  Cardiovascular: Normal rate and regular rhythm.  Exam reveals no gallop and no friction rub.   No murmur heard. Pulmonary/Chest: Effort normal and breath sounds normal. No respiratory distress. He has no wheezes. He has no rales. He exhibits no tenderness.  Abdominal: Soft. Bowel sounds are normal. He exhibits no distension and no mass. There is no tenderness. There is no rebound and no guarding.  Genitourinary: Penis normal. No penile tenderness.  Musculoskeletal: Normal range of motion. He exhibits no edema and no tenderness.  Lymphadenopathy:    He has no cervical adenopathy.  Neurological: He is alert and oriented to  person, place, and time. He has normal reflexes.  Skin: Skin is warm.    ED Course  Procedures (including critical care time) Labs Review Labs Reviewed - No data to display Imaging Review No results found.  MDM  No diagnosis found. 32 year old African male presents emergency Department with a one-month history of mild headache and "feeling out of it".  Patient has a concerning travel history as he recently traveled to and from [place].  Pt denies fever, chills, neck pain, cough, nausea vomiting diarrhea, abdominal pain, frequency urgency dysuria, rash, or associated symptoms. He does endorse mild shortness of breath which is chronic and intermittent and may be associated with his diagnoses of anxiety.  ER course: Upon arriving to the emergency department patient was immediately masked and placed in a negative pressure isolation room. Myself and RN Ernesta Amble performed a history and physical on the patient and we were in appropriate PPE to include head cover, respiratory mask M95, the face shield, gown, and foot covers.  Stat page to ID.  CDC algorithm consulted:   12:13 AM Lengthy discussion with Dr. Ninetta Lights ID @ [facility] regarding the patient and his history and physical and the likelihood for people virus and need for testing. Based on the chronicity of the patient's symptoms, his lack of fever or other concerning symptoms with the exception of a mild headache, his normal vital signs and physical exam, and his lack of contact with patients with known or suspected E. Bola virus - a joint decision was made to discharge patient home. Multiple attempts were made to contact [facility] Department switchboard [phone]) but a voicemail was obtained. A message was left on the voicemail.  No indication for further testing at this time.  No indication for medication at this time.    Strict discharge precautions were provided to the patient to include: #1 Pt instructed  to monitor temperature twice daily for the next 21 days. Pt given a thermometer. #2 No commercial travel for 21 days. #3 Patient is to followup in ER  for any concerning symptoms of fever, cough, headache, weakness, muscle pain, vomiting, diarrhea, abdominal pains, or hemorrhage. #4. Pt was provided masks to wear when in contact with public.    The contact information was obtained.  [address]  [place] [address] Friend's phone number: [phone]  2:56 AM [facility] Department of Epidemiology called ER to speak to me regarding this case.  I spoke with Dr. Lossie Faes (Epidemiologist on Call) and reported H and P to her in detail.  She agreed that no testing was indicated and she agreed with plan.  Patient's contact information was provided to her  for followup purposes.  Darlys Gales, MD 04/29/13 [phone]

## 2013-04-28 NOTE — ED Notes (Addendum)
Headache, feels "like heart rate been  Slow/high." in the [place] x 1 week - From [place]. States, "feels like hormonal changes."  Denies, fever, chills, diarrhea. Deniesany contact with people dx. With E Bola from [place].

## 2013-04-28 NOTE — ED Notes (Signed)
Dr. Ninetta Lights with Infectious disease paged to [facility] @ [phone] Windy Kalata D

## 2013-04-29 NOTE — ED Notes (Signed)
Ot;s friend called to transport pt back to his home

## 2013-04-29 NOTE — ED Notes (Signed)
[address]  [place] [address] No Home Phone

## 2013-06-05 ENCOUNTER — Emergency Department (HOSPITAL_COMMUNITY)
Admission: EM | Admit: 2013-06-05 | Discharge: 2013-06-06 | Disposition: A | Discharge status: Other Acute Inpatient Hospital | Payer: Federal, State, Local not specified - Other | Attending: Emergency Medicine | Admitting: Emergency Medicine

## 2013-06-05 ENCOUNTER — Encounter (HOSPITAL_COMMUNITY): Payer: Self-pay | Admitting: Emergency Medicine

## 2013-06-05 DIAGNOSIS — F062 Psychotic disorder with delusions due to known physiological condition: Secondary | ICD-10-CM

## 2013-06-05 DIAGNOSIS — R45851 Suicidal ideations: Secondary | ICD-10-CM | POA: Insufficient documentation

## 2013-06-05 DIAGNOSIS — F22 Delusional disorders: Secondary | ICD-10-CM | POA: Insufficient documentation

## 2013-06-05 DIAGNOSIS — Z79899 Other long term (current) drug therapy: Secondary | ICD-10-CM | POA: Insufficient documentation

## 2013-06-05 LAB — COMPREHENSIVE METABOLIC PANEL
ALT: 52 U/L (ref 0–53)
AST: 45 U/L — ABNORMAL HIGH (ref 0–37)
BUN: 13 mg/dL (ref 6–23)
CO2: 25 mEq/L (ref 19–32)
Calcium: 10.2 mg/dL (ref 8.4–10.5)
Chloride: 97 mEq/L (ref 96–112)
Creatinine, Ser: 0.94 mg/dL (ref 0.50–1.35)
GFR calc Af Amer: >90 mL/min (ref >90)
Glucose, Bld: 136 mg/dL — ABNORMAL HIGH (ref 70–99)
Sodium: 134 mEq/L — ABNORMAL LOW (ref 135–145)
Total Protein: 8.2 g/dL (ref 6.0–8.3)

## 2013-06-05 LAB — RAPID URINE DRUG SCREEN, HOSP PERFORMED
Barbiturates: NOT DETECTED
Benzodiazepines: NOT DETECTED
Cocaine: NOT DETECTED
Opiates: NOT DETECTED

## 2013-06-05 LAB — CBC
HCT: 40 % (ref 39.0–52.0)
MCH: 29.7 pg (ref 26.0–34.0)
MCHC: 36.3 g/dL — ABNORMAL HIGH (ref 30.0–36.0)
MCV: 82 fL (ref 78.0–100.0)
Platelets: 248 10*3/uL (ref 150–400)
RBC: 4.88 MIL/uL (ref 4.22–5.81)
WBC: 7.2 10*3/uL (ref 4.0–10.5)

## 2013-06-05 MED ORDER — ONDANSETRON HCL 4 MG PO TABS: 4.0000 mg | ORAL_TABLET | Freq: Three times a day (TID) | ORAL | Status: DC | PRN

## 2013-06-05 MED ORDER — IBUPROFEN 200 MG PO TABS: 600.0000 mg | ORAL_TABLET | Freq: Three times a day (TID) | ORAL | Status: DC | PRN

## 2013-06-05 MED ORDER — ACETAMINOPHEN 325 MG PO TABS
650.0000 mg | ORAL_TABLET | ORAL | Status: DC | PRN
  Administered 2013-06-06: 650 mg via ORAL
  Filled 2013-06-05: qty 2

## 2013-06-05 MED ORDER — LORAZEPAM 1 MG PO TABS
1.0000 mg | ORAL_TABLET | Freq: Three times a day (TID) | ORAL | Status: DC | PRN
  Administered 2013-06-06: 1 mg via ORAL
  Filled 2013-06-05: qty 1

## 2013-06-05 MED ORDER — ALUM & MAG HYDROXIDE-SIMETH 200-200-20 MG/5ML PO SUSP: 30.0000 mL | ORAL | Status: DC | PRN

## 2013-06-05 MED ORDER — ZOLPIDEM TARTRATE 5 MG PO TABS: 5.0000 mg | ORAL_TABLET | Freq: Every evening | ORAL | Status: DC | PRN

## 2013-06-05 MED ORDER — NICOTINE 21 MG/24HR TD PT24: 21.0000 mg | MEDICATED_PATCH | Freq: Every day | TRANSDERMAL | Status: DC

## 2013-06-05 NOTE — ED Provider Notes (Signed)
CSN: [id number]     Arrival date & time 06/05/13  2024 History   First MD Initiated Contact with Patient 06/05/13 2049     This chart was scribed for Gerald Madura PA-C, a non-physician practitioner working with No att. providers found by Lewanda Rife, ED Scribe. This patient was seen in room [facility] and the patient's care was started at 5:39 AM    Chief Complaint  Patient presents with  . Medical Clearance   (Consider location/radiation/quality/duration/timing/severity/associated sxs/prior Treatment) The history is provided by the patient. No language interpreter was used.   HPI Comments: Gerald Ballard is a 32 y.o. male brought in by ambulance, who presents to the Emergency Department complaining of suicidal ideations onset "a while" (does not want to specify). Reports PMHx of similar thoughts. Denies precipitating factors. Pt states he is "weak in spirits and wants to die". Denies associated HI, auditory hallucinations, abdominal pain, other pain, and visual hallucinations. States he does not want to share plan with [place]. States he has fire arms at home. Reports he drinks alcohol. Denies illicit drug use.    Past Medical History  Diagnosis Date  . No pertinent past medical history   . Mental disorder    Past Surgical History  Procedure Laterality Date  . No past surgeries     Family History  Problem Relation Age of Onset  . Diabetes Father    History  Substance Use Topics  . Smoking status: Never Smoker   . Smokeless tobacco: Not on file  . Alcohol Use: No    Review of Systems  Psychiatric/Behavioral: Positive for suicidal ideas. Negative for hallucinations and self-injury.  All other systems reviewed and are negative.   A complete 10 system review of systems was obtained and all systems are negative except as noted in the HPI and PMHx.    Allergies  Pork-derived products  Home Medications   Current Outpatient Rx  Name  Route  Sig  Dispense  Refill  .  benztropine (COGENTIN) 1 MG tablet   Oral   Take 1 tablet (1 mg total) by mouth 2 (two) times daily. For side effects.   60 tablet   0   . fluPHENAZine (PROLIXIN) 10 MG tablet   Oral   Take 1 tablet (10 mg total) by mouth 2 (two) times daily after a meal. For clarity of thought and psychosis.   60 tablet   0   . lisinopril (PRINIVIL,ZESTRIL) 10 MG tablet   Oral   Take 1 tablet (10 mg total) by mouth daily. For hypertension.   30 tablet   0   . LORazepam (ATIVAN) 2 MG tablet   Oral   Take 1 tablet (2 mg total) by mouth every 6 (six) hours as needed for anxiety (agitation).   15 tablet   0   . mirtazapine (REMERON SOL-TAB) 30 MG disintegrating tablet   Oral   Take 1 tablet (30 mg total) by mouth at bedtime. For insomnia.   30 tablet   0    BP 171/97  Pulse 94  Temp(Src) 97.9 F (36.6 C) (Oral)  Resp 18  SpO2 100%  Physical Exam  Nursing note and vitals reviewed. Constitutional: He is oriented to person, place, and time. He appears well-developed and well-nourished. No distress.  HENT:  Head: Normocephalic and atraumatic.  Eyes: Conjunctivae and EOM are normal. No scleral icterus.  Neck: Normal range of motion.  Cardiovascular: Normal rate and regular rhythm.   No murmur heard. Pulmonary/Chest: Effort  normal and breath sounds normal. No respiratory distress.  Abdominal: Soft. There is no tenderness.  Musculoskeletal: Normal range of motion.  Neurological: He is alert and oriented to person, place, and time.  Skin: Skin is warm and dry. No rash noted. He is not diaphoretic. No erythema. No pallor.  Psychiatric: His speech is delayed. He is withdrawn. Cognition and memory are normal. He expresses suicidal ideation. He expresses no homicidal ideation. He expresses suicidal plans (does not want to specify ).  Flat affect    ED Course  Procedures   COORDINATION OF CARE:  Nursing notes reviewed. Vital signs reviewed. Initial pt interview and examination performed.    5:39 AM-Discussed work up plan with pt at bedside, which includes CBC, CMP, Ethanol, and UDS. Pt agrees with plan.  Treatment plan initiated: Medications  LORazepam (ATIVAN) tablet 1 mg (1 mg Oral Given 06/06/13 0509)  acetaminophen (TYLENOL) tablet 650 mg (650 mg Oral Given 06/06/13 0509)  ibuprofen (ADVIL,MOTRIN) tablet 600 mg (not administered)  zolpidem (AMBIEN) tablet 5 mg (not administered)  nicotine (NICODERM CQ - dosed in mg/24 hours) patch 21 mg (not administered)  ondansetron (ZOFRAN) tablet 4 mg (not administered)  alum & mag hydroxide-simeth (MAALOX/MYLANTA) 200-200-20 MG/5ML suspension 30 mL (not administered)   Initial diagnostic testing ordered.    Labs Review Labs Reviewed  CBC - Abnormal; Notable for the following:    MCHC 36.3 (*)    All other components within normal limits  COMPREHENSIVE METABOLIC PANEL - Abnormal; Notable for the following:    Sodium 134 (*)    Glucose, Bld 136 (*)    AST 45 (*)    Total Bilirubin 1.5 (*)    All other components within normal limits  ETHANOL  URINE RAPID DRUG SCREEN (HOSP PERFORMED)   Imaging Review No results found.  EKG Interpretation   None       MDM   1. Suicidal ideations    32 year old male presents for suicidal thoughts. Patient delayed and withdrawn on initial presentation and is slow to answer questions. Will not expand on suicidal thoughts or current plan though he does admit to having one. Patient denies HI, Etoh use, and illicit drug use. Patient unable to contract for safety.  Physical exam and labs unremarkable. Consult to TTS and psych hold orders placed. Based on my interaction with the patient, favor inpatient tx at [facility]. TTS eval pending at this time.  I personally performed the services described in this documentation, which was scribed in my presence. The recorded information has been reviewed and is accurate.     Gerald Madura, PA-C 06/06/13 [phone]

## 2013-06-05 NOTE — ED Notes (Signed)
Pt. And belongings searched and wanded by security. Pt. Has 2 bags. Pt. Has pant,shirt,shoes,jacket,t-shirt,underwear. Pt. Belongings locked up at the nurses station in triage.

## 2013-06-05 NOTE — ED Notes (Signed)
Pt brought in by EMS with c/o SI  Pt's dr was on scene and states he was seen 3 weeks ago for same  Pt states he has been feeling like this for a while now  Denies HI

## 2013-06-05 NOTE — ED Notes (Signed)
Pt states he is feeling suicidal  Pt states he has no plan  Pt states it is "a mercy killing"  Pt holds eye contact during conversation

## 2013-06-05 NOTE — ED Notes (Signed)
Pt's dr coming in  Pt's roommate going to take out IVC papers on pt

## 2013-06-06 ENCOUNTER — Inpatient Hospital Stay (HOSPITAL_COMMUNITY)
Admission: AD | Admit: 2013-06-06 | Discharge: 2013-06-16 | DRG: 885 | Disposition: A | Discharge status: Home / Self Care | Payer: Federal, State, Local not specified - Other | Source: Intra-hospital | Attending: Psychiatry | Admitting: Psychiatry

## 2013-06-06 ENCOUNTER — Encounter (HOSPITAL_COMMUNITY): Payer: Self-pay | Admitting: Registered Nurse

## 2013-06-06 DIAGNOSIS — R443 Hallucinations, unspecified: Secondary | ICD-10-CM

## 2013-06-06 DIAGNOSIS — F259 Schizoaffective disorder, unspecified: Principal | ICD-10-CM | POA: Diagnosis present

## 2013-06-06 DIAGNOSIS — R45851 Suicidal ideations: Secondary | ICD-10-CM

## 2013-06-06 DIAGNOSIS — Z79899 Other long term (current) drug therapy: Secondary | ICD-10-CM

## 2013-06-06 DIAGNOSIS — F062 Psychotic disorder with delusions due to known physiological condition: Secondary | ICD-10-CM | POA: Diagnosis present

## 2013-06-06 MED ORDER — FLUPHENAZINE HCL 10 MG PO TABS
10.0000 mg | ORAL_TABLET | Freq: Two times a day (BID) | ORAL | Status: DC
  Administered 2013-06-06 (×2): 10 mg via ORAL
  Filled 2013-06-06 (×2): qty 1

## 2013-06-06 MED ORDER — BENZTROPINE MESYLATE 1 MG PO TABS
1.0000 mg | ORAL_TABLET | Freq: Two times a day (BID) | ORAL | Status: DC
  Administered 2013-06-06 (×2): 1 mg via ORAL
  Filled 2013-06-06 (×2): qty 1

## 2013-06-06 MED ORDER — MIRTAZAPINE 30 MG PO TBDP
30.0000 mg | ORAL_TABLET | Freq: Every day | ORAL | Status: DC
  Administered 2013-06-06: 30 mg via ORAL
  Filled 2013-06-06: qty 1

## 2013-06-06 NOTE — ED Provider Notes (Signed)
7:02 PM Pt hase been accepted to [facility] under Dr. Jannifer Franklin.  I was not directly involved in pt care or decision making.   1. 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

## 2013-06-06 NOTE — Consult Note (Signed)
  Face to Face interview/consult with Dr. Ladona Ridgel  Subjective:  Patient states that he is responding to voices and he has been hearing them for a while.  Patient appears to be responding to internal stimuli during interview. Patient staring voice low and continues to pull hands up to head and back down.  Patient states that he is here because "I tried to get out of the car.  I took the keys and ran.  The lady called the police and they brought me here.  Psychiatric Specialty Exam: Physical Exam  ROS  Blood pressure 126/81, pulse 110, temperature 98.3 F (36.8 C), temperature source Oral, resp. rate 18, SpO2 98.00%.There is no weight on file to calculate BMI.  General Appearance: Bizarre  Eye Contact::  Good  Speech:  Clear and Coherent and Slow  Volume:  Decreased  Mood:  Relaxed  Affect:  Blunt, Depressed, Flat and Restricted  Thought Process:  Disorganized  Orientation:  Other:  Person  Thought Content:  Hallucinations: Auditory  Suicidal Thoughts:  No  Homicidal Thoughts:  No  Memory:  Unable to determine  Judgement:  Impaired  Insight:  Unable to determine  Psychomotor Activity:  Decreased  Concentration:  Poor  Recall:  Unable to determine  Akathisia:  No  Handed:  Right  AIMS (if indicated):     Assets:  Desire for Improvement  Sleep:      Disposition:  Inpatient treatment recommended.  Monitor for safety and stabilization until inpatient bed is found.   Start home medications  Anessa Charley B. Kobi Aller FNP-BC Family Nurse Practitioner, Board Certified

## 2013-06-06 NOTE — ED Notes (Signed)
Patient discharge via ambulatory with a steady gait. No acute distress noted. 

## 2013-06-06 NOTE — BH Assessment (Addendum)
Notified EDP Megan Dockerty that pt has been accepted to [facility] assigned to bed 406-1. Pt accepted by Assunta Found, NP assigned to Dr[facility] for inpatient treatment. All Appropriate support documentation has been completed and faxed to [facility] for review.  Glorious Peach, MS, LCASA Assessment Counselor

## 2013-06-06 NOTE — Progress Notes (Signed)
Patient listed as not having insurnace or pcp.  Patient's pcp from community at bedside.  Patient's pcp is DR. Erlinda Hong.  As per Dr. Olen Cordial, patient is from [place].  He does not have any family here.  Patient was found wandering the streets when he was younger and was found by a woman.  This woman brought the patient to Dr. Thurmond Butts.  Dr. Thurmond Butts has been paying for the patients medications.  Dr. Thurmond Butts reports this patient also worked at [employer] at some point.  EDCM placed resources on patient's chart which includes, financial resources in the community such as local churches and Holiday representative, information regarding Medicaid and [employer] Act for insurance, discounted pharmacies and website needymeds.org for medication assistance, crisis intervention information, local shelters and dental assistance for uninsured patients.  When speaking with the patient, the patient continually stared off in a distance, did not look at [facility]. Voice was low and at times difficult to understand patient.  Resources placed on patient's chart until patient is feeling better.

## 2013-06-06 NOTE — BH Assessment (Signed)
Assessment Note  Gerald Ballard is a 32 y.o. male brought in by EMS with SI with intent to harm self.  Pt will disclose plan to this Clinical research associate.  Pt is hearing voices with command to harm self and telling pt--you're mad(crazy) and "you will die".  This Clinical research associate observed the following during pt.'s interview: pt has a blank stare and is responding to internal stimuli.  He told this Clinical research associate he feels a vision around him but is unable to elaborate on the meaning of this statement.  Pt.'s body is tremulous and says that he is cold, another blanket was provided but pt body movements continue to tremble.  When asked open ended questions, pt has trouble explaining what he is experiencing and will mimic/repeat this writer's words and actions.  Pt is anxious and asked the nurse not to kill him; has not slept all night and is in an upright position without moving for several hours.  He appears frightened at times and stands in front of the of nurse's station and speaks softly(monotone).  Per nurse, medication has been provided to help him with anxiety.  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  Pt has repeatedly asked this writer to take him to his uncle's home and repeats the address [address].  Pt has very flat affect.     Axis I: Psychotic Disorder NOS Axis II: Deferred Axis III:  Past Medical History  Diagnosis Date  . No pertinent past medical history   . Mental disorder    Axis IV: other psychosocial or environmental problems, problems related to social environment and problems with primary support group Axis V: 21-30 behavior considerably influenced by delusions or hallucinations OR serious impairment in judgment, communication OR inability to function in almost all areas  Past Medical History:  Past Medical History  Diagnosis Date  . No pertinent past medical history   . Mental disorder     Past Surgical  History  Procedure Laterality Date  . No past surgeries      Family History:  Family History  Problem Relation Age of Onset  . Diabetes Father     Social History:  reports that he has never smoked. He does not have any smokeless tobacco history on file. He reports that he does not drink alcohol or use illicit drugs.  Additional Social History:  Alcohol / Drug Use Pain Medications: See MAR  Prescriptions: See MAR  Over the Counter: See MAR  History of alcohol / drug use?: No history of alcohol / drug abuse Longest period of sobriety (when/how long): Pt unable to verify information   CIWA: CIWA-Ar BP: 134/89 mmHg Pulse Rate: 108 COWS:    Allergies:  Allergies  Allergen Reactions  . Pork-Derived Products     unknown    Home Medications:  (Not in a hospital admission)  OB/GYN Status:  No LMP for male patient.  General Assessment Data Location of Assessment: [facility] Is this a Tele or Face-to-Face Assessment?: Face-to-Face Is this an Initial Assessment or a Re-assessment for this encounter?: Initial Assessment Living Arrangements: Non-relatives/Friends (Lives with roommate ) Can pt return to current living arrangement?: Yes Admission Status: Voluntary Is patient capable of signing voluntary admission?: No Transfer from: Acute Hospital Referral Source: MD  Medical Screening Exam [facility] Walk-in ONLY) Medical Exam completed: No Reason for MSE not completed: Other: (None )  [facility] Crisis Care Plan Living  Arrangements: Non-relatives/Friends (Lives with roommate ) Name of Psychiatrist: None  Name of Therapist: None   Education Status Is patient currently in school?: Yes Current Grade: [employer]  Highest grade of school patient has completed: None  Name of school: None  Contact person: None   Risk to self Suicidal Ideation: Yes-Currently Present Suicidal Intent: Yes-Currently Present Is patient at risk for suicide?: Yes Suicidal Plan?: Yes-Currently Present Specify Current  Suicidal Plan: Pt will not disclose plan  Access to Means: Yes Specify Access to Suicidal Means: Pt will not disclose means  What has been your use of drugs/alcohol within the last 12 months?: Pt denies  Previous Attempts/Gestures: No How many times?: 0 Other Self Harm Risks: None  Triggers for Past Attempts: None known Intentional Self Injurious Behavior: None Family Suicide History: No Recent stressful life event(s): Other (Comment) (Psychosis ) Persecutory voices/beliefs?: Yes Depression: No Depression Symptoms:  (None reported ) Substance abuse history and/or treatment for substance abuse?: No Suicide prevention information given to non-admitted patients: Not applicable  Risk to Others Homicidal Ideation: No Thoughts of Harm to Others: No Current Homicidal Intent: No Current Homicidal Plan: No Access to Homicidal Means: No Identified Victim: None  History of harm to others?: No Assessment of Violence: None Noted Violent Behavior Description: None  Does patient have access to weapons?: No Criminal Charges Pending?: No Does patient have a court date: No  Psychosis Hallucinations: Auditory;With command Delusions: None noted  Mental Status Report Appear/Hygiene:  (Appropriate ) Eye Contact: Other (Comment) (Pt has blank stare ) Motor Activity: Psychomotor retardation Speech: Slow (Repetative speech ) Level of Consciousness: Alert Mood: Preoccupied Affect: Preoccupied Anxiety Level: None Thought Processes: Flight of Ideas Judgement: Impaired Orientation: Person;Place;Time;Situation Obsessive Compulsive Thoughts/Behaviors: None  Cognitive Functioning Concentration: Decreased Memory: Recent Intact;Remote Intact IQ: Average Insight: Poor Impulse Control: Poor Appetite: Poor Weight Loss: 0 Weight Gain: 0 Sleep: Decreased Total Hours of Sleep:  (Intermittent Sleep ) Vegetative Symptoms: None  ADLScreening [facility] Assessment Services) Patient's cognitive ability  adequate to safely complete daily activities?: Yes Patient able to express need for assistance with ADLs?: Yes Independently performs ADLs?: Yes (appropriate for developmental age)  Prior Inpatient Therapy Prior Inpatient Therapy: No Prior Therapy Dates: None  Prior Therapy Facilty/Provider(s): None  Reason for Treatment: None   Prior Outpatient Therapy Prior Outpatient Therapy: No Prior Therapy Dates: None  Prior Therapy Facilty/Provider(s): None  Reason for Treatment: None   ADL Screening (condition at time of admission) Patient's cognitive ability adequate to safely complete daily activities?: Yes Is the patient deaf or have difficulty hearing?: No Does the patient have difficulty seeing, even when wearing glasses/contacts?: No Does the patient have difficulty concentrating, remembering, or making decisions?: Yes Patient able to express need for assistance with ADLs?: Yes Does the patient have difficulty dressing or bathing?: No Independently performs ADLs?: Yes (appropriate for developmental age) Does the patient have difficulty walking or climbing stairs?: No Weakness of Legs: None Weakness of Arms/Hands: None  Home Assistive Devices/Equipment Home Assistive Devices/Equipment: None  Therapy Consults (therapy consults require a physician order) PT Evaluation Needed: No OT Evalulation Needed: No SLP Evaluation Needed: No Abuse/Neglect Assessment (Assessment to be complete while patient is alone) Physical Abuse: Denies Verbal Abuse: Denies Sexual Abuse: Denies Exploitation of patient/patient's resources: Denies Self-Neglect: Denies Values / Beliefs Cultural Requests During Hospitalization: None Spiritual Requests During Hospitalization: None Consults Spiritual Care Consult Needed: No Social Work Consult Needed: No Merchant navy officer (For Healthcare) Advance Directive: Patient does not have advance directive;Patient  would not like information Pre-existing out of  facility DNR order (yellow form or pink MOST form): No Nutrition Screen- [facility] Adult/[facility] Patient's home diet: Regular  Additional Information 1:1 In Past 12 Months?: No CIRT Risk: No Elopement Risk: No Does patient have medical clearance?: Yes     Disposition:  Disposition Initial Assessment Completed for this Encounter: Yes Disposition of Patient: Inpatient treatment program;Referred to (Accepted pending a 400 Hall bed--[facility] [place], [place] ) Type of inpatient treatment program: Adult Patient referred to: Other (Comment) (Accepted to 400 hall bed--[facility] [place], [place] )  On Site Evaluation by:   Reviewed with Physician:    Murrell Redden 06/06/2013 5:28 AM

## 2013-06-07 ENCOUNTER — Encounter (HOSPITAL_COMMUNITY): Payer: Self-pay | Admitting: Behavioral Health

## 2013-06-07 DIAGNOSIS — F062 Psychotic disorder with delusions due to known physiological condition: Secondary | ICD-10-CM

## 2013-06-07 DIAGNOSIS — R45851 Suicidal ideations: Secondary | ICD-10-CM

## 2013-06-07 MED ORDER — ACETAMINOPHEN 325 MG PO TABS: 650.0000 mg | ORAL_TABLET | Freq: Four times a day (QID) | ORAL | Status: DC | PRN

## 2013-06-07 MED ORDER — ENSURE COMPLETE PO LIQD
237.0000 mL | Freq: Three times a day (TID) | ORAL | Status: DC
  Administered 2013-06-07 – 2013-06-16 (×24): 237 mL via ORAL

## 2013-06-07 MED ORDER — MAGNESIUM HYDROXIDE 400 MG/5ML PO SUSP: 30.0000 mL | Freq: Every day | ORAL | Status: DC | PRN

## 2013-06-07 MED ORDER — OLANZAPINE 5 MG PO TBDP: 5.0000 mg | ORAL_TABLET | Freq: Three times a day (TID) | ORAL | Status: DC | PRN

## 2013-06-07 MED ORDER — ALUM & MAG HYDROXIDE-SIMETH 200-200-20 MG/5ML PO SUSP: 30.0000 mL | ORAL | Status: DC | PRN

## 2013-06-07 MED ORDER — INFLUENZA VAC SPLIT QUAD 0.5 ML IM SUSP
0.5000 mL | INTRAMUSCULAR | Status: AC
  Administered 2013-06-08: 0.5 mL via INTRAMUSCULAR
  Filled 2013-06-07: qty 0.5

## 2013-06-07 MED ORDER — BENZTROPINE MESYLATE 1 MG PO TABS
1.0000 mg | ORAL_TABLET | Freq: Two times a day (BID) | ORAL | Status: DC
  Administered 2013-06-07 – 2013-06-16 (×18): 1 mg via ORAL
  Filled 2013-06-07 (×2): qty 1
  Filled 2013-06-07: qty 28
  Filled 2013-06-07 (×8): qty 1
  Filled 2013-06-07: qty 28
  Filled 2013-06-07 (×11): qty 1

## 2013-06-07 MED ORDER — MIRTAZAPINE 30 MG PO TBDP
30.0000 mg | ORAL_TABLET | Freq: Every day | ORAL | Status: DC
  Administered 2013-06-07: 30 mg via ORAL
  Filled 2013-06-07 (×2): qty 1

## 2013-06-07 MED ORDER — FLUPHENAZINE HCL 10 MG PO TABS
10.0000 mg | ORAL_TABLET | Freq: Two times a day (BID) | ORAL | Status: DC
  Administered 2013-06-07 – 2013-06-11 (×8): 10 mg via ORAL
  Filled 2013-06-07 (×12): qty 1

## 2013-06-07 NOTE — Progress Notes (Signed)
Nutrition Brief Note  Patient identified on the Malnutrition Screening Tool (MST) Report.  Wt Readings from Last 10 Encounters:  06/06/13 126 lb (57.153 kg)  07/29/12 114 lb (51.71 kg)   Body mass index is 18.88 kg/(m^2). Patient meets criteria for normal weight based on current BMI.   Discussed intake PTA with patient and compared to intake presently.  Discussed changes in intake, if any, and encouraged adequate intake of meals and snacks. Pt reports living with his uncle PTA. Reports eating 2-3 meals/day with good appetite. Reports feeling "lighter" like he has lost weight, but does not know how much. Had requested Ensure earlier in the day per RN, will order.   Current diet order is regular and pt is also offered choice of unit snacks mid-morning and mid-afternoon.  Pt is eating as desired.   Labs and medications reviewed. AST elevated.   Nutrition Dx:  Unintended wt change r/t suboptimal oral intake AEB pt report  Interventions:   Discussed the importance of nutrition and encouraged intake of food and beverages.     Supplements: Ensure Complete TID   No additional nutrition interventions warranted at this time. If nutrition issues arise, please consult RD.   Levon Hedger MS, RD, LDN [phone] Pager [phone] After Hours Pager

## 2013-06-07 NOTE — H&P (Signed)
Psychiatric Admission Assessment Adult  Patient Identification:  Gerald Ballard Date of Evaluation:  06/07/2013  Chief Complaint:  Hearing voices, SI  History of Present Illness::Gerald Ballard is an 32 y/o male, immigrant from [place] who was brought to the [facility] via EMS voluntarily expressing SI with intent to harm self. The patient has been hearing command voices telling him that he is crazy and will die according to the [facility] assessment note dated 06/06/13. The patient is a very poor historian due to his current psychotic state. Patient is very vague in his statements "I wanted treatment." When probed further he stated "I want to clear some things up in my mind. I have been having crazy thoughts for a few months. I want to think clear so I can have a relationship. I took my medications until they were finished last time. I did not have the money to continue with them. I get money from my Uncle." The patient presents with thought blocking and appears to be responding to internal stimuli. Gerald Ballard is hard to understand and gave conflicting answer to many questions which required much clarification on writer's part. He received inpatient services from [facility] in January 2014 on the 400 hall.   Elements:  Location:  [facility] inpatient.. Quality:  patient reports psychosis Severity:  symptoms severe enough to prevent patient from functioning in daily life Timing:  Last few months Duration: At least a year Context:  unemployed, not taking any medications, financial problems   Associated Signs/Synptoms: Depression Symptoms:  disturbed sleep, (Hypo) Manic Symptoms:  Delusions, Distractibility, Hallucinations, Anxiety Symptoms:  Excessive Worry, Psychotic Symptoms:  Delusions, Hallucinations: Auditory Visual Paranoia, PTSD Symptoms: None reported  Psychiatric Specialty Exam: Physical Exam  Constitutional:  Physical exam findings reviewed from the ED and concur with no exceptions.   Psychiatric: His  mood appears anxious. His speech is tangential. He is actively hallucinating. Thought content is paranoid. Cognition and memory are normal. He expresses inappropriate judgment.    Review of Systems  Constitutional: Negative.   HENT: Negative.   Eyes: Negative.   Respiratory: Negative.   Cardiovascular: Negative.   Gastrointestinal: Negative.   Genitourinary: Negative.   Musculoskeletal: Negative.   Skin: Negative.   Neurological: Negative.   Endo/Heme/Allergies: Negative.   Psychiatric/Behavioral: Positive for hallucinations. The patient is nervous/anxious and has insomnia.     Blood pressure 140/89, pulse 118, temperature 98.1 F (36.7 C), temperature source Oral, resp. rate 18, height 5' 8.5" (1.74 m), weight 57.153 kg (126 lb).Body mass index is 18.88 kg/(m^2).  General Appearance: Bizarre, Disheveled and Guarded  Eye Contact::  Poor  Speech:  Blocked and Slow  Volume:  Decreased  Mood:  Anxious  Affect:  Blunt  Thought Process:  Circumstantial and Disorganized  Orientation:  Full (Time, Place, and Person)  Thought Content:  Delusions, Hallucinations: Auditory and Paranoid Ideation  Suicidal Thoughts:  Yes.  without intent/plan  Homicidal Thoughts:  No  Memory:  Immediate;   Fair Recent;   Fair Remote;   Fair  Judgement:  Poor  Insight:  Lacking  Psychomotor Activity:  Psychomotor Retardation  Concentration:  Poor  Recall:  Fair  Akathisia:  No  Handed:  Right  AIMS (if indicated):     Assets:  Physical Health Social Support  Sleep:  Number of Hours: 6    Past Psychiatric History:Yes  Diagnosis:Psychotic Disorder NOS  Hospitalizations: [facility] Jan 2014  Outpatient Care:Did not follow up as directed  Substance Abuse Care:Denies  Self-Mutilation:Denies  Suicidal  Attempts:Denies  Violent Behaviors:Denies   Past Medical History:   Past Medical History  Diagnosis Date  . No pertinent past medical history   . Mental disorder     Allergies:   Allergies  Allergen  Reactions  . Pork-Derived Products     unknown   PTA Medications: Prescriptions prior to admission  Medication Sig Dispense Refill  . benztropine (COGENTIN) 1 MG tablet Take 1 tablet (1 mg total) by mouth 2 (two) times daily. For side effects.  60 tablet  0  . fluPHENAZine (PROLIXIN) 10 MG tablet Take 1 tablet (10 mg total) by mouth 2 (two) times daily after a meal. For clarity of thought and psychosis.  60 tablet  0  . lisinopril (PRINIVIL,ZESTRIL) 10 MG tablet Take 1 tablet (10 mg total) by mouth daily. For hypertension.  30 tablet  0  . LORazepam (ATIVAN) 2 MG tablet Take 1 tablet (2 mg total) by mouth every 6 (six) hours as needed for anxiety (agitation).  15 tablet  0  . mirtazapine (REMERON SOL-TAB) 30 MG disintegrating tablet Take 1 tablet (30 mg total) by mouth at bedtime. For insomnia.  30 tablet  0    Previous Psychotropic Medications:  Medication/Dose:  Haldol 5 mg BID  Cogentin 1 mg BID  Remeron 15 mg hs           Substance Abuse History in the last 12 months:  no  Consequences of Substance Abuse: NA  Social History:  reports that he has never smoked. He does not have any smokeless tobacco history on file. He reports that he does not drink alcohol or use illicit drugs. Additional Social History:                      Current Place of Residence:   Place of Birth:  [place] Family Members: Aunt and Education officer, community  Marital Status:  Single Children:0  Sons:  Daughters: Relationships: Education:  Corporate treasurer Problems/Performance: Religious Beliefs/Practices: History of Abuse (Emotional/Phsycial/Sexual)Denies Teacher, music History:  None. Legal History:Denies Hobbies/Interests:  Family History:   Family History  Problem Relation Age of Onset  . Diabetes Father     Results for orders placed during the hospital encounter of 06/05/13 (from the past 72 hour(s))  CBC     Status: Abnormal   Collection Time    06/05/13  8:50 PM       Result Value Range   WBC 7.2  4.0 - 10.5 K/uL   RBC 4.88  4.22 - 5.81 MIL/uL   Hemoglobin 14.5  13.0 - 17.0 g/dL   HCT 16.1  09.6 - 04.5 %   MCV 82.0  78.0 - 100.0 fL   MCH 29.7  26.0 - 34.0 pg   MCHC 36.3 (*) 30.0 - 36.0 g/dL   RDW 40.9  81.1 - 91.4 %   Platelets 248  150 - 400 K/uL  COMPREHENSIVE METABOLIC PANEL     Status: Abnormal   Collection Time    06/05/13  8:50 PM      Result Value Range   Sodium 134 (*) 135 - 145 mEq/L   Potassium 3.8  3.5 - 5.1 mEq/L   Chloride 97  96 - 112 mEq/L   CO2 25  19 - 32 mEq/L   Glucose, Bld 136 (*) 70 - 99 mg/dL   BUN 13  6 - 23 mg/dL   Creatinine, Ser 7.82  0.50 - 1.35 mg/dL   Calcium 95.6  8.4 - 21.3  mg/dL   Total Protein 8.2  6.0 - 8.3 g/dL   Albumin 4.6  3.5 - 5.2 g/dL   AST 45 (*) 0 - 37 U/L   ALT 52  0 - 53 U/L   Alkaline Phosphatase 88  39 - 117 U/L   Total Bilirubin 1.5 (*) 0.3 - 1.2 mg/dL   GFR calc non Af Amer >90  >90 mL/min   GFR calc Af Amer >90  >90 mL/min   Comment: (NOTE)     The eGFR has been calculated using the CKD EPI equation.     This calculation has not been validated in all clinical situations.     eGFR's persistently <90 mL/min signify possible Chronic Kidney     Disease.  ETHANOL     Status: None   Collection Time    06/05/13  8:50 PM      Result Value Range   Alcohol, Ethyl (B) <11  0 - 11 mg/dL   Comment:            LOWEST DETECTABLE LIMIT FOR     SERUM ALCOHOL IS 11 mg/dL     FOR MEDICAL PURPOSES ONLY  URINE RAPID DRUG SCREEN (HOSP PERFORMED)     Status: None   Collection Time    06/05/13  9:05 PM      Result Value Range   Opiates NONE DETECTED  NONE DETECTED   Cocaine NONE DETECTED  NONE DETECTED   Benzodiazepines NONE DETECTED  NONE DETECTED   Amphetamines NONE DETECTED  NONE DETECTED   Tetrahydrocannabinol NONE DETECTED  NONE DETECTED   Barbiturates NONE DETECTED  NONE DETECTED   Comment:            DRUG SCREEN FOR MEDICAL PURPOSES     ONLY.  IF CONFIRMATION IS NEEDED     FOR ANY PURPOSE,  NOTIFY LAB     WITHIN 5 DAYS.                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 No pertinent past medical history   . Mental disorder    AXIS IV:  economic problems, occupational problems and other psychosocial or environmental problems AXIS V:  21-30 behavior considerably influenced by delusions or hallucinations OR serious impairment in judgment, communication OR inability to function in almost all areas  Treatment Plan/Recommendations:  1. Admit for crisis management and stabilization. 2. Medication management to reduce current symptoms to base line and improve the patient's overall level of functioning. Start Prolixin 10 mg BID for psychosis. Cogentin 1 mg BID for EPS prevention. Remeron 30 mg at bedtime for insomnia.  3. Treat health problems  as indicated. 4. Develop treatment plan to decrease risk of relapse upon discharge and the need for readmission. 5. Psycho-social education regarding relapse prevention and self care. 6. Health care follow up as needed for medical problems. 7. Restart home medications where appropriate.   Treatment Plan Summary: Daily contact with patient to assess and evaluate symptoms and progress in treatment Medication management Current Medications:  No current facility-administered medications for this encounter.    Observation Level/Precautions:  routine  Laboratory:  routine  Psychotherapy:  Group Sessions  Medications:  See list   Consultations:  As needed   Discharge Concerns:  Safety and Stability   Estimated LOS: 5-7  days   Other:  Obtain collateral information from family    I certify that inpatient services furnished can reasonably be expected to improve the patient's condition.   Fransisca Kaufmann, NP-C 11/12/20149:21 AM   Seen and agreed. Thedore Mins, MD

## 2013-06-07 NOTE — BHH Counselor (Signed)
Adult Psychosocial Assessment Update Interdisciplinary Team  Previous [facility] admissions/discharges:  Admissions Discharges  Date: 07/28/12 Date:  Date: Date:  Date: Date:  Date: Date:  Date: Date:   Changes since the last Psychosocial Assessment (including adherence to outpatient mental health and/or substance abuse treatment, situational issues contributing to decompensation and/or relapse). Gerald Ballard is an 32 y/o male, immigrant from [place], [place] who was brought to the ED by his Aunt for evaluation. Patient reports that his roommates has been doing "weird stuff" in the apartment he shares with them. He also reports that he recently saw a movie entitled "Helix" where a patient went to the hospital and never came back home. He reports he has since been thinking that could be him. Patient reports seeing things in his dream, hearing voices of no one in particular and has not been sleeping well, worry about "being cut open" in his sleep. He also states that he can make time move forward and backward through the television. He denies suicidal ideation or history of self-harm. He denies homicidal ideation or history of violence.  According to the ER records, his aunt reports that he came to their residence saying he didn't feel safe and wanted to stay at their house. Aunt says patient has moved four times over a brief period and they asked where he was staying but he couldn't remember the address. He eventually led them to an apartment the he shares with 3 other men and reports that he had covered all the vents because he believe people are trying to contaminate him. He believes people in other apartments are talking about him. "I don't want to be outside because there is too much outside so it is better to stay in my room asleep... I fear for my safety." Aunt reports patient moved to [place] from [place] four years ago. He lost his job at [employer] about one year ago. Aunt is not aware  of any family history of mental health problems and does not believe he has seen a psychiatrist or counselor in the past.               Discharge Plan 1. Will you be returning to the same living situation after discharge?   Yes:X  home No:      If no, what is your plan?           2. Would you like a referral for services when you are discharged? Yes: X    If yes, for what services?  No:       [facility]       Summary and Recommendations (to be completed by the evaluator) Gerald Ballard is a returning immigrant from [place] with limited insight who has not been taking medications or following up at mental health since his last admission with resulting disorganization and paranoia.  He can benefit from crises stabilization, medication management, therapeutic milieu and referral for services.                       Signature:  [name], 06/07/2013 11:17 AM

## 2013-06-07 NOTE — [facility] Suicide Risk Assessment (Signed)
Suicide Risk Assessment  Admission Assessment     Nursing information obtained from:    Demographic factors:    Current Mental Status:    Loss Factors:    Historical Factors:    Risk Reduction Factors:     CLINICAL FACTORS:   Depression:   Delusional Impulsivity Insomnia Schizophrenia:   Paranoid or undifferentiated type Currently Psychotic  COGNITIVE FEATURES THAT CONTRIBUTE TO RISK:  Closed-mindedness Polarized thinking    SUICIDE RISK:   Mild:  Suicidal ideation of limited frequency, intensity, duration, and specificity.  There are no identifiable plans, no associated intent, mild dysphoria and related symptoms, good self-control (both objective and subjective assessment), few other risk factors, and identifiable protective factors, including available and accessible social support.  PLAN OF CARE:1. Admit for crisis management and stabilization. 2. Medication management to reduce current symptoms to base line and improve the     patient's overall level of functioning 3. Treat health problems as indicated. 4. Develop treatment plan to decrease risk of relapse upon discharge and the need for     readmission. 5. Psycho-social education regarding relapse prevention and self care. 6. Health care follow up as needed for medical problems. 7. Restart home medications where appropriate.   I certify that inpatient services furnished can reasonably be expected to improve the patient's condition.  Carlin Mamone,MD 06/07/2013, 11:14 AM

## 2013-06-07 NOTE — [facility] Group Notes (Signed)
Adult Psychoeducational Group Note  Date:  06/07/2013 Time:  9:08 PM  Group Topic/Focus:  Wrap-Up Group:   The focus of this group is to help patients review their daily goal of treatment and discuss progress on daily workbooks.  Participation Level:  Minimal  Participation Quality:  Attentive  Affect:  Flat  Cognitive:  Appropriate  Insight: Good  Engagement in Group:  Limited  Modes of Intervention:  Discussion  Additional Comments:  Gerald Ballard stated his day was good.  He stated he took a shower, the nurses checked on him and he is a sense of the place.  Caroll Rancher A 06/07/2013, 9:08 PM

## 2013-06-07 NOTE — Tx Team (Signed)
  Interdisciplinary Treatment Plan Update   Date Reviewed:  06/07/2013  Time Reviewed:  7:47 AM  Progress in Treatment:   Attending groups: Yes Participating in groups: Yes Taking medication as prescribed: Yes  Tolerating medication: Yes Family/Significant other contact made: No Patient understands diagnosis: Yes As evidenced by asking for help with symptoms of psychosis and accompanying anxiety Discussing patient identified problems/goals with staff: Yes  See initial care plan Medical problems stabilized or resolved: Yes Denies suicidal/homicidal ideation: Yes  In tx team Patient has not harmed self or others: Yes  For review of initial/current patient goals, please see plan of care.  Estimated Length of Stay:  4-5 days  Reason for Continuation of Hospitalization: Delusions  Depression Hallucinations Medication stabilization  New Problems/Goals identified:  N/A  Discharge Plan or Barriers:   return home, follow up outpt  Additional Comments: Gerald Ballard is a 32 y.o. male brought in by ambulance, who presents to the Emergency Department complaining of suicidal ideations onset "a while" (does not want to specify). Reports PMHx of similar thoughts. Denies precipitating factors. Pt states he is "weak in spirits and wants to die". Denies associated HI, auditory hallucinations, abdominal pain, other pain, and visual hallucinations. States he does not want to share plan with [place]. States he has fire arms at home. Reports he drinks alcohol. Denies illicit drug use.     Attendees:  Signature: Thedore Mins, MD 06/07/2013 7:47 AM   Signature: Richelle Ito, LCSW 06/07/2013 7:47 AM  Signature: Fransisca Kaufmann, NP 06/07/2013 7:47 AM  Signature: Joslyn Devon, RN 06/07/2013 7:47 AM  Signature: Liborio Nixon, RN 06/07/2013 7:47 AM  Signature:  06/07/2013 7:47 AM  Signature:   06/07/2013 7:47 AM  Signature:    Signature:    Signature:    Signature:    Signature:    Signature:       Scribe for Treatment Team:   Richelle Ito, LCSW  06/07/2013 7:47 AM

## 2013-06-07 NOTE — ED Provider Notes (Signed)
Medical screening examination/treatment/procedure(s) were performed by non-physician practitioner and as supervising physician I was immediately available for consultation/collaboration.  EKG Interpretation   None         Lazlo Tunney L Bryler Dibble, MD 06/07/13 0950 

## 2013-06-07 NOTE — Consult Note (Signed)
Note reviewed and agreed with  

## 2013-06-07 NOTE — Progress Notes (Signed)
Recreation Therapy Notes  Date: 11.12.2014 Time: 9:30am Location: [address] Dayroom  Group Topic: Leisure Education  Goal Area(s) Addresses:  Patient will identify positive leisure/recreation activities. Patient will identify benefit of positive leisure and recreation.   Behavioral Response: Sleeping  Intervention: Game  Activity: Leisure ABC's. LRT wrote letters of alphabet on white board, as a group patients were asked to identify leisure/recreation activities to correspond with letters of alphabet.    Education:  Leisure Education, Building control surveyor  Education Outcome: Needs additional education  Clinical Observations/Feedback: Patient attended group session, but was observed to sleep throughout group.   Marykay Lex Annisa Mazzarella, LRT/CTRS  Jearl Klinefelter 06/07/2013 4:29 PM

## 2013-06-07 NOTE — Progress Notes (Signed)
Patient ID: Gerald Ballard, male   DOB: [birth year], 32 y.o.   MRN: [id number]   Pt was sedated during the adm process. Writer observed that pt was guarded but was cooperative with much prompting.  Report stated pt went to ED by EMS with c/o suicidal thought no plan. Report states pt replied, "he was weak in spirits and wanted to die".  Pt also reported A/H but didn't know what they were saying. Writer allowed pt to eat while Clinical research associate was getting shift report at 2300, in hopes that pt would be alert enough to finish the process. However, at apprx 2330, pt was in bed and appeared to be sleeping.

## 2013-06-07 NOTE — [facility] Group Notes (Signed)
[facility] Group Notes:  (Nursing/MHT/Case Management/Adjunct)  Date:  06/07/2013 Time:  2:18 PM  Type of Therapy:  Group Therapy   Participation Level:  Minimal  Participation Quality:  Attentive  Affect:  Flat  Cognitive:  Appropriate  Insight:  Limited  Engagement in Group:  Limited  Modes of Intervention: Discussion, Exploration and Socialization   Summary of Progress/Problems:  The topic for group was coping skills. Pt participated in the discussion about when coping skills are needed and what coping skills they have used in their life. Gerald Ballard sat there quietly and listened to group.  He identified a distracting coping skill as "being distracted by someone walking by."  His thinking is concrete.  Simona Huh MSW Intern  06/07/2013 2:18 PM

## 2013-06-07 NOTE — Progress Notes (Signed)
Adult Psychoeducational Group Note  Date:  06/07/2013 Time:  11:00AM Group Topic/Focus:  personal development  Participation Level:  Did Not Attend  Additional Comments: Pt. Didn't attend group.   Bing Plume D 06/07/2013, 2:57 PM

## 2013-06-07 NOTE — [facility] Group Notes (Signed)
[facility] LCSW Aftercare Discharge Planning Group Note   06/07/2013 7:48 AM  Participation Quality:  Minimal  Mood/Affect:  Flat  Depression Rating:  7  Anxiety Rating:  7  Thoughts of Suicide:  No Will you contract for safety?   NA  Current AVH:  Yes  Plan for Discharge/Comments:  Dillon is not a very accurate historian, and his response to questions is minimal.  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  Admits that he has not been seeing a psychiatrist, and has not been taking medications.  Presents as guarded, suspicious with latent answers.  Transportation Means:  unk  Supports: aunt  Lindsay, Baldo Daub

## 2013-06-07 NOTE — Progress Notes (Signed)
Patient ID: Gerald Ballard, male   DOB: 1981/01/20, 32 y.o.   MRN: [id number] DPt denies SI/HI but reports hearing voices telling him to hurt himself. Pt is guarded and forwards little information. Pt slow to respond and cautious when answering questions. Pt presents with tangential speech and disorganized thoughts. Pt have some mild confusion about what to do and when to perform certain task. A: Medications administered as ordered per MD. Verbal support given. Pt encouraged to attend groups. 15 minute checks performed for safety. R: Pt safety maintained.

## 2013-06-07 NOTE — Progress Notes (Signed)
Patient ID: Gerald Ballard, male   DOB: 11/19/1980, 32 y.o.   MRN: [id number] D: Pt is awake and active on the unit this PM. Pt endorses auditory hallucinations but states that "the medication is helping." He denies suicidality. Pt mood is depressed/withdrawn and his affect is flat/anxious. Pt is spending most of this time in his room and only comes out on occasion for meals and activities. Pt does seem to be responding to internal stimuli and is preoccupied. However, he is polite and cooperative with staff.  A: Encouraged pt to discuss feelings with staff and administered medication per MD orders. Writer also encouraged pt to participate in groups.  R: Pt is attending groups and tolerating medications well. Writer will continue to monitor. 15 minute checks are ongoing for safety.

## 2013-06-08 DIAGNOSIS — F323 Major depressive disorder, single episode, severe with psychotic features: Secondary | ICD-10-CM

## 2013-06-08 DIAGNOSIS — F259 Schizoaffective disorder, unspecified: Principal | ICD-10-CM | POA: Diagnosis present

## 2013-06-08 MED ORDER — FLUOXETINE HCL 20 MG PO CAPS
20.0000 mg | ORAL_CAPSULE | Freq: Every day | ORAL | Status: DC
  Administered 2013-06-09 – 2013-06-13 (×5): 20 mg via ORAL
  Filled 2013-06-08 (×6): qty 1

## 2013-06-08 MED ORDER — TRAZODONE HCL 50 MG PO TABS
50.0000 mg | ORAL_TABLET | Freq: Every day | ORAL | Status: DC
  Administered 2013-06-08 – 2013-06-15 (×7): 50 mg via ORAL
  Filled 2013-06-08 (×6): qty 1
  Filled 2013-06-08: qty 14
  Filled 2013-06-08 (×3): qty 1

## 2013-06-08 NOTE — Progress Notes (Signed)
D:Patient in bed awake during this assessment. Mood and affect flat and depresed. He said he was all right; although mood and affects incongruent, He denied SI/HI and denied hallucination. He reported feeling that his medications working.  A: Writer encouraged and supported patient R: Patient receptive to encouragement and support. Q 15 minute check continues to maintain safety.

## 2013-06-08 NOTE — Progress Notes (Signed)
Patient resting quietly with eyes closed. Respirations even and unlabored. No distress noted, Q 15 minute check continues as ordered to maintain safety.  

## 2013-06-08 NOTE — Progress Notes (Signed)
D:  Patient up and visible in the milieu.  Attending groups, but participation level is minimal.  He has a difficult time answering questions, but did mention that he did not follow up with mental health after he was last discharged from here.  He denies suicidal ideation, but is responding to internal stimuli.  His appetite is fair.  He is taking his medications. A:  Medications given as prescribed.  Offered support and encouragement to patient.  Reminded him of the importance of follow up care when he is finally discharged.   R:  Cooperative with staff.  Minimal interaction with peers, but has been up in the dayroom several times today.  Safety is maintained with 15 minute checks.

## 2013-06-08 NOTE — Progress Notes (Signed)
[facility] MD Progress Note  06/08/2013 10:47 AM Gerald Ballard  MRN:  [id number] Subjective: " I did not take my medications after I was discharged last time , I came back when I started hearing voices again." Objective: Patient reports ongoing command auditory hallucinations, says that he has been hearing voices making derogatory comments about him and telling him to hurt himself. He also reports having excessive worries, feeling depressed, hopeless, paranoid and anxious. He appears hypervigilant and suspicious. He is willing to get back on medications to help with his symptoms. 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   HENT: Negative.   Eyes: Negative.   Respiratory: Negative.   Cardiovascular: Negative.   Gastrointestinal: Negative.   Genitourinary: Negative.   Musculoskeletal: Negative.   Skin: Negative.   Neurological: Negative.   Endo/Heme/Allergies: Negative.   Psychiatric/Behavioral: Positive for depression, suicidal ideas and hallucinations. The patient is nervous/anxious.     Blood pressure 143/99, pulse 108, temperature 98.5 F (36.9 C), temperature source Oral, resp. rate 20, height 5' 8.5" (1.74 m), weight 57.153 kg (126 lb).Body mass index is 18.88 kg/(m^2).  General Appearance: Disheveled  Eye Contact::  Minimal  Speech:  Slow  Volume:  Decreased  Mood:  Anxious and Depressed  Affect:  Flat  Thought Process:  Disorganized  Orientation:  Full (Time,  Place, and Person)  Thought Content:  Delusions and Hallucinations: Auditory  Suicidal Thoughts:  No  Homicidal Thoughts:  No  Memory:  Immediate;   Fair Recent;   Fair Remote;   Fair  Judgement:  Fair  Insight:  Fair  Psychomotor Activity:  Decreased  Concentration:  Fair  Recall:  Fair  Akathisia:  No  Handed:  Right  AIMS (if indicated):     Assets:  Communication Skills Desire for Improvement Physical Health  Sleep:  Number of Hours: 6.5   Current Medications: Current Facility-Administered Medications  Medication Dose Route Frequency Provider Last Rate Last Dose  . acetaminophen (TYLENOL) tablet 650 mg  650 mg Oral Q6H PRN Shuvon Rankin, NP      . alum & mag hydroxide-simeth (MAALOX/MYLANTA) 200-200-20 MG/5ML suspension 30 mL  30 mL Oral Q4H PRN Shuvon Rankin, NP      . benztropine (COGENTIN) tablet 1 mg  1 mg Oral BID Shuvon Rankin, NP   1 mg at 06/08/13 0816  . feeding supplement (ENSURE COMPLETE) (ENSURE COMPLETE) liquid 237 mL  237 mL Oral TID BM Lavena Bullion, RD   237 mL at 06/07/13 2000  . [START ON 06/09/2013] FLUoxetine (PROZAC) capsule 20 mg  20 mg Oral Daily Viana Sleep      . fluPHENAZine (PROLIXIN) tablet 10 mg  10 mg Oral BID Shuvon Rankin, NP   10 mg at 06/08/13 0816  . influenza vac split quadrivalent PF (FLUARIX) injection 0.5 mL  0.5 mL Intramuscular Tomorrow-1000 Fransisca Kaufmann, NP      . magnesium hydroxide (MILK OF MAGNESIA) suspension 30 mL  30 mL Oral Daily PRN Shuvon Rankin, NP      . OLANZapine zydis (ZYPREXA) disintegrating tablet 5 mg  5  mg Oral Q8H PRN Fransisca Kaufmann, NP      . traZODone (DESYREL) tablet 50 mg  50 mg Oral QHS Gibbs Naugle        Lab Results: No results found for this or any previous visit (from the past 48 hour(s)).  Physical Findings: AIMS: Facial and Oral Movements Muscles of Facial Expression: None, normal Lips and Perioral Area: None, normal Jaw: None, normal Tongue: None, normal,Extremity Movements Upper (arms,  wrists, hands, fingers): None, normal Lower (legs, knees, ankles, toes): None, normal, Trunk Movements Neck, shoulders, hips: None, normal, Overall Severity Severity of abnormal movements (highest score from questions above): None, normal Incapacitation due to abnormal movements: None, normal Patient's awareness of abnormal movements (rate only patient's report): No Awareness, Dental Status Current problems with teeth and/or dentures?: No Does patient usually wear dentures?: No  CIWA:    COWS:     Treatment Plan Summary: Daily contact with patient to assess and evaluate symptoms and progress in treatment Medication management  Plan:1. Admit for crisis management and stabilization. 2. Medication management to reduce current symptoms to base line and improve the     patient's overall level of functioning 3. Treat health problems as indicated. 4. Develop treatment plan to decrease risk of relapse upon discharge and the need for     readmission. 5. Psycho-social education regarding relapse prevention and self care. 6. Health care follow up as needed for medical problems. 7. Restart home medications where appropriate.   Medical Decision Making Problem Points:  Established problem, worsening (2), Review of last therapy session (1) and Review of psycho-social stressors (1) Data Points:  Order Aims Assessment (2) Review of medication regiment & side effects (2) Review of new medications or change in dosage (2)  I certify that inpatient services furnished can reasonably be expected to improve the patient's condition.   Thedore Mins, MD 06/08/2013, 10:47 AM

## 2013-06-08 NOTE — [facility] Group Notes (Signed)
[facility] Group Notes:  (Counselor/Nursing/MHT/Case Management/Adjunct)  06/08/2013 1:15PM  Type of Therapy:  Group Therapy  Participation Level:  Minimal  Participation Quality:  Appropriate  Affect:  Flat  Cognitive:  Disorganized  Insight:  Limited  Engagement in Group:  Limited  Engagement in Therapy:  Limited  Modes of Intervention:  Discussion, Exploration and Socialization  Summary of Progress/Problems: The topic for group was balance in life.  Pt participated in the discussion about when their life was in balance and out of balance and how this feels.  Pt discussed ways to get back in balance and short term goals they can work on to get where they want to be. Jaziel appeared to be paying attention throughout, but had little to share.  When asked directly about how he can find balance, he stated "I look to others for cues, and that tells me how to proceed."   [name] 06/08/2013 2:00 PM

## 2013-06-09 NOTE — Progress Notes (Signed)
Recreation Therapy Notes  Date: 11.14.2014 Time: 9:30am Location: [address] Dayroom  Group Topic: Self-Expression  Goal Area(s) Addresses:  Patient will identify use art as a means of self-expression.  Patient will identify why they chose to draw what they did.  Patient will identify benefit of using self-expression.   Behavioral Response: Engaged, Appropriate, Sharing  Intervention: Art  Activity: LRT played instrumental music and asked patients to draw freely. Patients were provided paper, coloring pencils and crayons to complete their drawing.   Education:  Pharmacologist, Building control surveyor.   Education Outcome: Acknowledges understanding  Clinical Observations/Feedback: Patient actively engaged in activity, drawing a butterfly. Patient explained that both side of the drawing look the same and he would like to look the same way. When LRT asked patient to clarify patient was unable to do so, stating he wanted to feel like the butterfly. Patient drew what appeared to be a unhappy face on the butterfly, when LRT asked about the butterfly's expression patient found a pencil and drew a smile on the butterfly.   Group discussion focused on use of art as a coping mechanism post d/c. Patient did not contribute to group discussion.   Copy of drawing in patient paper chart.   Marykay Lex Daniele Dillow, LRT/CTRS  Jearl Klinefelter 06/09/2013 12:27 PM

## 2013-06-09 NOTE — [facility] Group Notes (Signed)
[facility] LCSW Group Therapy  06/09/2013  1:05 PM  Type of Therapy:  Group therapy  Participation Level:  Active  Participation Quality:  Attentive  Affect:  Flat  Cognitive:  Oriented  Insight:  Limited  Engagement in Therapy:  Limited  Modes of Intervention:  Discussion, Socialization  Summary of Progress/Problems:  Chaplain was here to lead a group on themes of hope and courage. Hope is aspiring for change.   States that this is a good place to be to get hope.  Admits to social isolation at home, and how that may get in the way of him feeling more hopeful. Encouraged by other group members to be more social. Gerald Ballard 06/09/2013 1:48 PM

## 2013-06-09 NOTE — Progress Notes (Signed)
Patient ID: Gerald Ballard, male   DOB: 1980-10-14, 32 y.o.   MRN: [id number] D. The patient is depressed and anxious. He is somewhat guarded and hypervigilant. His speech is soft and eye contact is minimal. Admits to auditory hallucinations, but denies any at present. A.Met with patient 1:1.  Encouraged to attend evening wrap up group. Spoke to patient about the importance of sleep and compliance with medication after discharge. R. The patient attended evening wrap up group. His participation was somewhat superficial. Stated his goal is to follow directions from staff. When encouraged to define some of his own plans for recovery, he was unable to.

## 2013-06-09 NOTE — Progress Notes (Signed)
Patient ID: Gerald Ballard, male   DOB: 11/06/1980, 32 y.o.   MRN: [id number] [facility] MD Progress Note  06/09/2013 12:35 PM Gerald Ballard  MRN:  [id number] Subjective: Patient states "I'm doing all right. I think the voices have started decreasing."   Objective: Patient reports ongoing command auditory hallucinations, says that he has been hearing voices making derogatory comments about him and telling him to hurt himself. He is able to contract for his safety on the unit agreeing to alert staff if he feels unsafe. He also reports having excessive worries, feeling depressed, hopeless, paranoid and anxious. He appears hypervigilant and suspicious. Patient is compliant with medications and denies any adverse affects.   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   HENT: Negative.   Eyes: Negative.   Respiratory: Negative.   Cardiovascular: Negative.   Gastrointestinal: Negative.   Genitourinary: Negative.   Musculoskeletal: Negative.   Skin: Negative.   Neurological: Negative.   Endo/Heme/Allergies: Negative.   Psychiatric/Behavioral: Positive for depression, suicidal ideas and hallucinations. The patient is nervous/anxious.     Blood pressure 123/84, pulse 98, temperature 97.4 F (36.3 C), temperature source Oral, resp. rate 16, height 5' 8.5" (1.74 m), weight 57.153 kg (126 lb).Body mass index is 18.88 kg/(m^2).  General Appearance: Disheveled  Eye Contact::  Minimal  Speech:   Slow  Volume:  Decreased  Mood:  Anxious and Depressed  Affect:  Flat  Thought Process:  Disorganized  Orientation:  Full (Time, Place, and Person)  Thought Content:  Delusions and Hallucinations: Auditory  Suicidal Thoughts:  No  Homicidal Thoughts:  No  Memory:  Immediate;   Fair Recent;   Fair Remote;   Fair  Judgement:  Fair  Insight:  Fair  Psychomotor Activity:  Decreased  Concentration:  Fair  Recall:  Fair  Akathisia:  No  Handed:  Right  AIMS (if indicated):     Assets:  Communication Skills Desire for Improvement Physical Health  Sleep:  Number of Hours: 6.75   Current Medications: Current Facility-Administered Medications  Medication Dose Route Frequency Provider Last Rate Last Dose  . acetaminophen (TYLENOL) tablet 650 mg  650 mg Oral Q6H PRN Shuvon Rankin, NP      . alum & mag hydroxide-simeth (MAALOX/MYLANTA) 200-200-20 MG/5ML suspension 30 mL  30 mL Oral Q4H PRN Shuvon Rankin, NP      . benztropine (COGENTIN) tablet 1 mg  1 mg Oral BID Shuvon Rankin, NP   1 mg at 06/09/13 0728  . feeding supplement (ENSURE COMPLETE) (ENSURE COMPLETE) liquid 237 mL  237 mL Oral TID BM Lavena Bullion, RD   237 mL at 06/09/13 0808  . FLUoxetine (PROZAC) capsule 20 mg  20 mg Oral Daily Mojeed Akintayo   20 mg at 06/09/13 0728  . fluPHENAZine (PROLIXIN) tablet 10 mg  10 mg Oral BID Shuvon Rankin, NP   10 mg at 06/09/13 0728  . magnesium hydroxide (MILK OF MAGNESIA) suspension 30 mL  30 mL Oral Daily PRN Shuvon Rankin, NP      . OLANZapine zydis (ZYPREXA) disintegrating tablet 5 mg  5 mg Oral Q8H PRN Fransisca Kaufmann, NP      . traZODone (DESYREL) tablet 50 mg  50 mg Oral QHS Mojeed Akintayo   50 mg at 06/08/13 2136    Lab Results: No results found for this or any previous visit (from the past 48 hour(s)).  Physical Findings: AIMS: Facial and Oral Movements Muscles of Facial Expression: None, normal Lips and Perioral Area: None, normal Jaw: None, normal Tongue: None,  normal,Extremity Movements Upper (arms, wrists, hands, fingers): None, normal Lower (legs, knees, ankles, toes): None, normal, Trunk Movements Neck, shoulders, hips: None, normal, Overall Severity Severity of abnormal movements (highest score from questions above): None, normal Incapacitation due to abnormal movements: None, normal Patient's awareness of abnormal movements (rate only patient's report): No Awareness, Dental Status Current problems with teeth and/or dentures?: No Does patient usually wear dentures?: No  CIWA:    COWS:     Treatment Plan Summary: Daily contact with patient to assess and evaluate symptoms and progress in treatment Medication management  Plan: 1. Continue crisis management and stabilization. 2. Medication management to reduce current symptoms to base line and improve the  patient's overall level of functioning. Continue Prozac 20 mg daily for depressive symptoms. Continue Prolixin 10 mg BID for psychosis.  3. Treat health problems as indicated. 4. Develop treatment plan to decrease risk of relapse upon discharge and the need for readmission. 5. Psycho-social education regarding relapse prevention and self care. 6. Health care follow up as needed for medical problems. 7. Restart home medications where appropriate.  Medical Decision Making Problem Points:  Established problem, worsening (2), Review of last therapy session (1) and Review of psycho-social stressors (1) Data Points:  Order Aims Assessment (2) Review of medication regiment & side effects (2) Review of new medications or change in dosage (2)  I certify that inpatient services furnished can reasonably be expected to improve the patient's condition.   Fransisca Kaufmann, NP-C 06/09/2013, 12:35 PM

## 2013-06-09 NOTE — [facility] Group Notes (Signed)
[facility] LCSW Aftercare Discharge Planning Group Note   06/09/2013 11:06 AM  Participation Quality:  Engaged  Mood/Affect:  Flat  Depression Rating:  denies  Anxiety Rating:  denies  Thoughts of Suicide:  No Will you contract for safety?   NA  Current AVH:  No  Plan for Discharge/Comments:  Gerald Ballard is a bit more engaged today.  He acknowledged that his uncle drives a truck and is often out of town.  States his home situation with good with roommates, and he plans to return there.  No other concerns or complaints.  Transportation Means: bus  Supports: aunt and uncle  [name]

## 2013-06-09 NOTE — Progress Notes (Signed)
Patient ID: Gerald Ballard, male   DOB: April 14, 1981, 32 y.o.   MRN: [id number] 06-09-13 nursing shift note: D: pt came to the medication window this am. He looked preoccupied and was very anxious.  Also he is suspicious and hypervigilant. He denied any si/hi and is having auditory hallucinations. A: RN made himself available to the patient and told him that if he felt like he was going to lose control to let RN and other staff know. R: he stated he would alert staff if he felt like he was going to loss control. On his inventory sheet he wrote: he slept well, appetite good, energy normal with his depression and hopelessness both at 2. Pain goal today is 2. RN will monitor and Q 15 min ck's continue.

## 2013-06-09 NOTE — [facility] Suicide Risk Assessment (Signed)
[facility] INPATIENT:  Family/Significant Other Suicide Prevention Education  Suicide Prevention Education:  Education Completed; [name] [phone] has been identified by the patient as the family member/significant other with whom the patient will be residing, and identified as the person(s) who will aid the patient in the event of a mental health crisis (suicidal ideations/suicide attempt).  With written consent from the patient, the family member/significant other has been provided the following suicide prevention education, prior to the and/or following the discharge of the patient.  The suicide prevention education provided includes the following:  Suicide risk factors  Suicide prevention and interventions  National Suicide Hotline telephone number  [facility] assessment telephone number  [facility] Emergency Assistance 911  [facility] and/or Residential Mobile Crisis Unit telephone number  Request made of family/significant other to:  Remove weapons (e.g., guns, rifles, knives), all items previously/currently identified as safety concern.    Remove drugs/medications (over-the-counter, prescriptions, illicit drugs), all items previously/currently identified as a safety concern.  The family member/significant other verbalizes understanding of the suicide prevention education information provided.  The family member/significant other agrees to remove the items of safety concern listed above.  Gerald Ballard 06/09/2013, 2:41 PM

## 2013-06-10 NOTE — Progress Notes (Signed)
Did not attend group 

## 2013-06-10 NOTE — Progress Notes (Signed)
Patient ID: Gerald Ballard, male   DOB: April 10, 1981, 32 y.o.   MRN: [id number] 06-10-13 nursing shift note: D: RN did a 1: 1 assessment with this patient. He stated his auditory hallucinations have subsided, he denied any si/hi and stated that if he began having thoughts about suicide or hurting himself,  he would make staff aware of these thoughts. He stated his depression was decreasing. A: RN made himself available to this patient and told him if he had any needs to please make the RN or staff aware of the need. R: he agreed he would ask for any help that he needed. R: On his inventory sheet he wrote: slept well, appetite improving, energy normal, attention improving with his depression and hopelessness both at 2. He denies any SI/HI. In the last 24 hrs he has felt lightheaded. After discharge he plans to "listen to my superiors and try to concentrate on what I do". RN will monitor and Q 15 min ck's continue.

## 2013-06-10 NOTE — Progress Notes (Signed)
Patient ID: Gerald Ballard, male   DOB: Apr 22, 1981, 32 y.o.   MRN: [id number] [facility] MD Progress Note  06/10/2013 6:08 PM Gerald Ballard  MRN:  [id number] Subjective: Met with the patient 1:1 today. He states he is doing ok and has no new complaints. He reports he is sleeping and eating well. He rates his depression as a2/10 and reports that he has no anxiety. Objective: Gerald Ballard is minimizing all of his symptoms as he did on his last admission. He is disoriented x 0/3, denies AVH, SI or HI. 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   HENT: Negative.   Eyes: Negative.   Respiratory: Negative.   Cardiovascular: Negative.   Gastrointestinal: Negative.   Genitourinary: Negative.   Musculoskeletal: Negative.   Skin: Negative.   Neurological: Negative.   Endo/Heme/Allergies: Negative.   Psychiatric/Behavioral: Positive for depression, suicidal ideas and hallucinations. The patient is nervous/anxious.     Blood pressure 120/77, pulse 98, temperature 97.7 F (36.5 C), temperature source Oral, resp. rate 18, height 5' 8.5" (1.74 m), weight 57.153 kg (126 lb).Body mass index is 18.88 kg/(m^2).  General Appearance: Disheveled  Eye Contact::  Minimal  Speech:  Slow  Volume:  Decreased  Mood:  Anxious and Depressed  Affect:  Flat  Thought Process:  Disorganized  Orientation:  Full (Time, Place, and Person)  Thought Content:  Delusions and Hallucinations: Auditory  Suicidal Thoughts:  No   Homicidal Thoughts:  No  Memory:  Immediate;   Fair Recent;   Fair Remote;   Fair  Judgement:  Fair  Insight:  Fair  Psychomotor Activity:  Decreased  Concentration:  Fair  Recall:  Fair  Akathisia:  No  Handed:  Right  AIMS (if indicated):     Assets:  Communication Skills Desire for Improvement Physical Health  Sleep:  Number of Hours: 6.75   Current Medications: Current Facility-Administered Medications  Medication Dose Route Frequency Provider Last Rate Last Dose  . acetaminophen (TYLENOL) tablet 650 mg  650 mg Oral Q6H PRN Shuvon Rankin, NP      . alum & mag hydroxide-simeth (MAALOX/MYLANTA) 200-200-20 MG/5ML suspension 30 mL  30 mL Oral Q4H PRN Shuvon Rankin, NP      . benztropine (COGENTIN) tablet 1 mg  1 mg Oral BID Shuvon Rankin, NP   1 mg at 06/10/13 1633  . feeding supplement (ENSURE COMPLETE) (ENSURE COMPLETE) liquid 237 mL  237 mL Oral TID BM Lavena Bullion, RD   237 mL at 06/10/13 1523  . FLUoxetine (PROZAC) capsule 20 mg  20 mg Oral Daily Mojeed Akintayo   20 mg at 06/10/13 0814  . fluPHENAZine (PROLIXIN) tablet 10 mg  10 mg Oral BID Shuvon Rankin, NP   10 mg at 06/10/13 1633  . magnesium hydroxide (MILK OF MAGNESIA) suspension 30 mL  30 mL Oral Daily PRN Shuvon Rankin, NP      . OLANZapine zydis (ZYPREXA) disintegrating tablet 5 mg  5 mg Oral Q8H PRN Fransisca Kaufmann, NP      . traZODone (DESYREL) tablet 50 mg  50 mg Oral QHS  Mojeed Akintayo   50 mg at 06/09/13 2121    Lab Results: No results found for this or any previous visit (from the past 48 hour(s)).  Physical Findings: AIMS: Facial and Oral Movements Muscles of Facial Expression: None, normal Lips and Perioral Area: None, normal Jaw: None, normal Tongue: None, normal,Extremity Movements Upper (arms, wrists, hands, fingers): None, normal Lower (legs, knees, ankles, toes): None, normal, Trunk Movements Neck, shoulders, hips: None, normal, Overall Severity Severity of abnormal movements (highest score from  questions above): None, normal Incapacitation due to abnormal movements: None, normal Patient's awareness of abnormal movements (rate only patient's report): No Awareness, Dental Status Current problems with teeth and/or dentures?: No Does patient usually wear dentures?: No  CIWA:    COWS:     Treatment Plan Summary: Daily contact with patient to assess and evaluate symptoms and progress in treatment Medication management  Plan: 1. Continue crisis management and stabilization. 2. Medication management to reduce current symptoms to base line and improve the  patient's overall level of functioning. Continue Prozac 20 mg daily for depressive symptoms. Continue Prolixin 10 mg BID for psychosis.  3. Treat health problems as indicated. 4. Develop treatment plan to decrease risk of relapse upon discharge and the need for readmission. 5. Psycho-social education regarding relapse prevention and self care. 6. Health care follow up as needed for medical problems. 7. Will follow for 1 more day to see if he continues to improve and he has made improvements.  Medical Decision Making Problem Points:  Established problem, worsening (2), Review of last therapy session (1) and Review of psycho-social stressors (1) Data Points:  Order Aims Assessment (2) Review of medication regiment & side effects (2) Review of new medications or change in dosage (2)  I certify that inpatient services furnished can reasonably be expected to improve the patient's condition.  Rona Ravens. Mashburn RPAC 6:11 PM 06/10/2013  Reviewed the information documented and agree with the treatment plan.  Geraldina Parrott,JANARDHAHA R. 06/12/2013 8:33 AM

## 2013-06-10 NOTE — [facility] Group Notes (Signed)
[facility] Group Notes:  (Clinical Social Work)  06/10/2013  11:15-11:45AM  Summary of Progress/Problems:   The main focus of today's process group was for the patient to identify ways in which they have in the past sabotaged their own recovery and reasons they may have done this/what they received from doing it.  We then worked to identify a specific plan to avoid doing this when discharged from the hospital for this admission.  The patient expressed that he has a tendency to go off his medications.  He talked disjointedly about trying to concentrate, but CSW could not follow.  He left the room and returned twice.  Type of Therapy:  Group Therapy - Process  Participation Level:  Minimal  Participation Quality:  Attentive and Inattentive  Affect:  Anxious and Flat  Cognitive:  Alert  Insight:  Developing/Improving  Engagement in Therapy:  Developing/Improving  Modes of Intervention:  Clarification, Education, Exploration, Discussion  Gerald Mantle, LCSW 06/10/2013, 12:52 PM

## 2013-06-10 NOTE — Progress Notes (Signed)
Patient ID: Gerald Ballard, male   DOB: 12-18-1980, 32 y.o.   MRN: [id number] Psychoeducational Group Note  Date:  06/10/2013 Time:0930am  Group Topic/Focus:  Identifying Needs:   The focus of this group is to help patients identify their personal needs that have been historically problematic and identify healthy behaviors to address their needs.  Participation Level:  Active  Participation Quality:  Appropriate  Affect:  Flat  Cognitive:  Appropriate  Insight:  Supportive  Engagement in Group:  Supportive  Additional Comments:  Inventory and psychoeducational group   Valente David 06/10/2013,10:17 AM

## 2013-06-10 NOTE — Progress Notes (Signed)
Patient ID: Gerald Ballard, male   DOB: [birth year], 32 y.o.   MRN: [id number] D.The patient has a depressed mood and flat affect. He spent the evening resting in bed. He appeared preoccupied.  A. Met with patient 1:1. Encouraged to attend evening wrap up group. Administered HS medication. R. The patient did not attend evening group. He stated that he still hears voices, but that they are subsiding and do not bother as much anymore. Denied suicidal/homicidal ideation. Compliant with medication.

## 2013-06-11 MED ORDER — FLUPHENAZINE HCL 10 MG PO TABS
10.0000 mg | ORAL_TABLET | ORAL | Status: DC
  Administered 2013-06-12: 10 mg via ORAL
  Filled 2013-06-11: qty 2
  Filled 2013-06-11 (×2): qty 1

## 2013-06-11 MED ORDER — FLUPHENAZINE HCL 5 MG PO TABS
15.0000 mg | ORAL_TABLET | Freq: Every day | ORAL | Status: DC
  Administered 2013-06-11: 21:00:00 15 mg via ORAL
  Filled 2013-06-11 (×2): qty 1

## 2013-06-11 NOTE — Progress Notes (Signed)
Patient ID: Gerald Ballard, male   DOB: [birth year], 32 y.o.   MRN: [id number] nursing shift note: D: this pt continues to be flat, disorganized, delusional and appears preoccupied. He is hesitant to speak and when he does it is in a very soft tone. He denies any si/hi.  A: staff continues to support and encourage this patient. RN ask him throughout the day if he needs are being met. R: he stated his needs are being met. On her inventory sheet he wrote. Slept fair, appetite good, energy normal with his depression and hopelessness both at 2. Physical problems in the last 24 hrs have been at headache. After discharge he plans to " listen to the nurses and my doctor. Socialize with people". RN will continue to monitor and Q 15 min ck's continue.

## 2013-06-11 NOTE — [facility] Group Notes (Signed)
[facility] Group Notes:  (Clinical Social Work)  06/11/2013   11:15am-12:00pm  Summary of Progress/Problems:  The main focus of today's process group was to listen to a variety of genres of music and to identify that different types of music provoke different responses.  The patient then was able to identify personally what was soothing for them, as well as energizing.  Handouts were used to record feelings evoked, as well as how patient can personally use this knowledge in sleep habits, with depression, and with other symptoms.  The patient expressed understanding of concepts, as well as knowledge of how each type of music affected them and how this can be used when they are at home as a tool in their recovery.  Type of Therapy:  Music Therapy   Participation Level:  Active  Participation Quality:  Attentive and Sharing (although the sharing was very stilted and never changed, using the same words each time)  Affect:  Blunted  Cognitive:  Oriented  Insight:  Engaged  Engagement in Therapy:  Engaged  Modes of Intervention:   Activity, Exploration  Ambrose Mantle, LCSW 06/11/2013, 12:30pm

## 2013-06-11 NOTE — Progress Notes (Signed)
Adult Psychoeducational Group Note  Date:  06/11/2013 Time:  8:00 pm  Group Topic/Focus:  Wrap-Up Group:   The focus of this group is to help patients review their daily goal of treatment and discuss progress on daily workbooks.  Participation Level:  Active  Participation Quality:  Appropriate and Sharing  Affect:  Appropriate  Cognitive:  Appropriate  Insight: Appropriate  Engagement in Group:  Engaged  Modes of Intervention:  Discussion, Education, Socialization and Support  Additional Comments:  Pt stated that he is in the hospital because he has not been taking his medication. Pt stated that he is a good listener and good at arranging things.   Latandra Loureiro 06/11/2013, 10:30 PM

## 2013-06-11 NOTE — Progress Notes (Signed)
Patient ID: Gerald Ballard, male   DOB: September 14, 1980, 32 y.o.   MRN: [id number] D. The patient has a depressed mood and flat affect. He forwards very little. Submissively follows direction. Very polite and respectful. Admits to still hearing voices at times, but denies at present. However, he appears distracted as if responding to internal stimuli. A. Met with patient 1:1 to assess. Encouraged to attend evening wrap up group.  Reviewed new medications and administered. R. Attended and participated in evening group. Stated that he wanted to learn how to socialize. He identified his uncle as his support system.

## 2013-06-11 NOTE — Progress Notes (Signed)
Patient ID: Gerald Ballard, male   DOB: [birth year], 32 y.o.   MRN: [id number] [facility] MD Progress Note  06/11/2013 3:58 PM Gerald Ballard  MRN:  [id number] Subjective: Met with the patient again today in his room. He continues to deny any symptoms, remains delusional, shows no insight into his situation, and minimizes his need to be hospitalized. 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   HENT: Negative.   Eyes: Negative.   Respiratory: Negative.   Cardiovascular: Negative.   Gastrointestinal: Negative.   Genitourinary: Negative.   Musculoskeletal: Negative.   Skin: Negative.   Neurological: Negative.   Endo/Heme/Allergies: Negative.   Psychiatric/Behavioral: Positive for depression, suicidal ideas and hallucinations. The patient is nervous/anxious.     Blood pressure 132/87, pulse 102, temperature 98.2 F (36.8 C), temperature source Oral, resp. rate 16, height 5' 8.5" (1.74 m), weight 57.153 kg (126 lb).Body mass index is 18.88 kg/(m^2).  General Appearance: Disheveled  Eye Contact::  Minimal  Speech:  Slow  Volume:  Decreased  Mood:  Anxious and Depressed  Affect:  Flat  Thought Process:  Disorganized  Orientation:  Full (Time, Place, and Person)  Thought Content:  Delusions and Hallucinations: Auditory  Suicidal Thoughts:  No  Homicidal Thoughts:  No  Memory:  Immediate;   Fair Recent;   Fair Remote;   Fair  Judgement:  Fair  Insight:  Fair  Psychomotor  Activity:  Decreased  Concentration:  Fair  Recall:  Fair  Akathisia:  No  Handed:  Right  AIMS (if indicated):     Assets:  Communication Skills Desire for Improvement Physical Health  Sleep:  Number of Hours: 6   Current Medications: Current Facility-Administered Medications  Medication Dose Route Frequency Provider Last Rate Last Dose  . acetaminophen (TYLENOL) tablet 650 mg  650 mg Oral Q6H PRN Shuvon Rankin, NP      . alum & mag hydroxide-simeth (MAALOX/MYLANTA) 200-200-20 MG/5ML suspension 30 mL  30 mL Oral Q4H PRN Shuvon Rankin, NP      . benztropine (COGENTIN) tablet 1 mg  1 mg Oral BID Shuvon Rankin, NP   1 mg at 06/11/13 0826  . feeding supplement (ENSURE COMPLETE) (ENSURE COMPLETE) liquid 237 mL  237 mL Oral TID BM Lavena Bullion, RD   237 mL at 06/11/13 1422  . FLUoxetine (PROZAC) capsule 20 mg  20 mg Oral Daily Mojeed Akintayo   20 mg at 06/11/13 0826  . fluPHENAZine (PROLIXIN) tablet 10 mg  10 mg Oral BID Shuvon Rankin, NP   10 mg at 06/11/13 0826  . magnesium hydroxide (MILK OF MAGNESIA) suspension 30 mL  30 mL Oral Daily PRN Shuvon Rankin, NP      . OLANZapine zydis (ZYPREXA) disintegrating tablet 5 mg  5 mg Oral Q8H PRN Fransisca Kaufmann, NP      . traZODone (DESYREL) tablet 50 mg  50 mg Oral QHS Mojeed Akintayo   50 mg at 06/10/13 2138    Lab Results: No results found for this or any previous visit (from the past 48 hour(s)).  Physical Findings: AIMS: Facial  and Oral Movements Muscles of Facial Expression: None, normal Lips and Perioral Area: None, normal Jaw: None, normal Tongue: None, normal,Extremity Movements Upper (arms, wrists, hands, fingers): None, normal Lower (legs, knees, ankles, toes): None, normal, Trunk Movements Neck, shoulders, hips: None, normal, Overall Severity Severity of abnormal movements (highest score from questions above): None, normal Incapacitation due to abnormal movements: None, normal Patient's awareness of abnormal movements (rate only  patient's report): No Awareness, Dental Status Current problems with teeth and/or dentures?: No Does patient usually wear dentures?: No  CIWA:    COWS:     Treatment Plan Summary: Daily contact with patient to assess and evaluate symptoms and progress in treatment Medication management  Plan: 1. Continue crisis management and stabilization. 2. Given that Gerald Ballard continues to be delusional, and does seem to be responding to internal stimulation, I will increase the prolixin by 5mg . 3. Will continue to monitor.   Medical Decision Making Problem Points:  Established problem, worsening (2), Review of last therapy session (1) and Review of psycho-social stressors (1) Data Points:  Order Aims Assessment (2) Review of medication regiment & side effects (2) Review of new medications or change in dosage (2)  I certify that inpatient services furnished can reasonably be expected to improve the patient's condition.   Rona Ravens. [place] RPAC 3:58 PM 06/11/2013  Reviewed the information documented and agree with the treatment plan.  Seanna Sisler,JANARDHAHA R. 06/12/2013 8:43 AM

## 2013-06-11 NOTE — Progress Notes (Signed)
Patient ID: Armin Yerger, male   DOB: [birth year], 30 y.o.   MRN: [id number] Psychoeducational Group Note  Date:  06/11/2013 Time:  0930am  Group Topic/Focus:  Making Healthy Choices:   The focus of this group is to help patients identify negative/unhealthy choices they were using prior to admission and identify positive/healthier coping strategies to replace them upon discharge.  Participation Level:  Minimal  Participation Quality:  lacking   Affect:  Flat  Cognitive:  Disorganized  Insight:  Limited  Engagement in Group:  Limited  Additional Comments:  Inventory and Psychoeducational group  Valente David 06/11/2013,10:13 AM

## 2013-06-12 MED ORDER — FLUPHENAZINE HCL 5 MG PO TABS
10.0000 mg | ORAL_TABLET | Freq: Two times a day (BID) | ORAL | Status: DC
  Administered 2013-06-12 – 2013-06-16 (×8): 10 mg via ORAL
  Filled 2013-06-12 (×4): qty 1
  Filled 2013-06-12: qty 56
  Filled 2013-06-12 (×4): qty 1
  Filled 2013-06-12: qty 56
  Filled 2013-06-12: qty 1
  Filled 2013-06-12: qty 2
  Filled 2013-06-12: qty 1
  Filled 2013-06-12: qty 2

## 2013-06-12 NOTE — Progress Notes (Signed)
Recreation Therapy Notes  Date: 11.17.2014 Time: 9:30am Location: [address] Dayroom  Group Topic: Gratitude  Goal Area(s) Addresses:  Patient will be able to identify things they are grateful for. Patient will be able to identify benefit of recognizing things they are grateful for.   Behavioral Response: Did not attend.   Marykay Lex Mahki Spikes, LRT/CTRS  Jearl Klinefelter 06/12/2013 12:24 PM

## 2013-06-12 NOTE — [facility] Group Notes (Signed)
[facility] LCSW Group Therapy  06/12/2013 1:15 pm  Type of Therapy: Process Group Therapy  Participation Level:  Active  Participation Quality:  Appropriate  Affect:  Flat  Cognitive:  Oriented  Insight:  Improving  Engagement in Group:  Limited  Engagement in Therapy:  Limited  Modes of Intervention:  Activity, Clarification, Education, Problem-solving and Support  Summary of Progress/Problems: Today's group addressed the issue of overcoming obstacles.  Patients were asked to identify their biggest obstacle post d/c that stands in the way of their on-going success, and then problem solve as to how to manage this.  Strother stated his goal is to gain more education, stay on his medication and think more clearly.  Admitted that for this to happen, he needs to act differently than the last time he left, which means he has to continue to take meds, and follow up at [facility].  [name] 06/12/2013   1:59 PM

## 2013-06-12 NOTE — Progress Notes (Signed)
Adult Psychoeducational Group Note  Date:  06/12/2013 Time:  11:34 AM  Group Topic/Focus:  Wellness Toolbox:   The focus of this group is to discuss various aspects of wellness, balancing those aspects and exploring ways to increase the ability to experience wellness.  Patients will create a wellness toolbox for use upon discharge.  Participation Level:  Did Not Attend  Participation Quality:    Affect:    Cognitive:    Insight:   Engagement in Group:    Modes of Intervention:    Additional Comments:  Pt refused to attend.  Tabetha Haraway M 06/12/2013, 11:34 AM  

## 2013-06-12 NOTE — Progress Notes (Signed)
D: Pt presents anxious this morning. Pt reports AH and continues to feel paranoid. Pt cautious today during shift assessment and forwarded little information. Pt isolates in his room with minimal participation attending groups. Pt withdrawn and appears to be responding to internal stimuli. A: Medications administered as ordered per MD. Verbal support given. Pt encouraged to attend groups. 15 minute checks performed for safety. R : Pt compliant with taking meds. Pt safety maintained.

## 2013-06-12 NOTE — Progress Notes (Signed)
Patient ID: Gerald Ballard, male   DOB: 28-Nov-1980, 32 y.o.   MRN: [id number] Seen and agreed. Thedore Mins, MD [facility] MD Progress Note  06/12/2013 10:08 AM Gerald Ballard  MRN:  [id number] Subjective: "I am still feeling like people are out to get me and depressed." Objective: Patient reports slight improvement of his symptoms. He reports paranoia and  auditory hallucinations; he continues to  hear voices making derogatory comments about him. He denies suicidal thoughts, feeling hopeless but reports excessive worries, anxiety and depression He remains hypervigilant and suspicious. He is compliant with his medications and did not report any adverse reactions to it. 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   HENT: Negative.   Eyes: Negative.   Respiratory: Negative.   Cardiovascular: Negative.   Gastrointestinal: Negative.   Genitourinary: Negative.   Musculoskeletal: Negative.   Skin: Negative.   Neurological: Negative.   Endo/Heme/Allergies: Negative.   Psychiatric/Behavioral: Positive for depression and hallucinations. The patient is nervous/anxious.     Blood pressure 128/85, pulse 107, temperature 97.8 F (36.6 C), temperature source Oral, resp. rate 18, height 5' 8.5" (1.74 m), weight 57.153 kg (126 lb).Body mass index is 18.88 kg/(m^2).  General Appearance: Disheveled  Eye Contact::  Minimal  Speech:  Slow  Volume:  Decreased  Mood:  Anxious and Depressed   Affect:  Flat  Thought Process:  Disorganized  Orientation:  Full (Time, Place, and Person)  Thought Content:  Delusions and Hallucinations: Auditory  Suicidal Thoughts:  No  Homicidal Thoughts:  No  Memory:  Immediate;   Fair Recent;   Fair Remote;   Fair  Judgement:  Fair  Insight:  Fair  Psychomotor Activity:  Decreased  Concentration:  Fair  Recall:  Fair  Akathisia:  No  Handed:  Right  AIMS (if indicated):     Assets:  Communication Skills Desire for Improvement Physical Health  Sleep:  Number of Hours: 6.75   Current Medications: Current Facility-Administered Medications  Medication Dose Route Frequency Provider Last Rate Last Dose  . acetaminophen (TYLENOL) tablet 650 mg  650 mg Oral Q6H PRN Shuvon Rankin, NP      . alum & mag hydroxide-simeth (MAALOX/MYLANTA) 200-200-20 MG/5ML suspension 30 mL  30 mL Oral Q4H PRN Shuvon Rankin, NP      . benztropine (COGENTIN) tablet 1 mg  1 mg Oral BID Shuvon Rankin, NP   1 mg at 06/12/13 0830  . feeding supplement (ENSURE COMPLETE) (ENSURE COMPLETE) liquid 237 mL  237 mL Oral TID BM Lavena Bullion, RD   237 mL at 06/11/13 2127  . FLUoxetine (PROZAC) capsule 20 mg  20 mg Oral Daily Peyton Rossner   20 mg at 06/12/13 0830  . fluPHENAZine (PROLIXIN) tablet 10 mg  10 mg Oral BID PC Saddie Sandeen      . magnesium hydroxide (MILK OF MAGNESIA) suspension 30 mL  30 mL Oral Daily PRN Shuvon Rankin, NP      . OLANZapine zydis (ZYPREXA) disintegrating tablet 5 mg  5 mg Oral Q8H PRN Fransisca Kaufmann, NP      .  traZODone (DESYREL) tablet 50 mg  50 mg Oral QHS Emmaly Leech   50 mg at 06/11/13 2126    Lab Results: No results found for this or any previous visit (from the past 48 hour(s)).  Physical Findings: AIMS: Facial and Oral Movements Muscles of Facial Expression: None, normal Lips and Perioral Area: None, normal Jaw: None, normal Tongue: None, normal,Extremity Movements Upper (arms, wrists, hands, fingers): None, normal Lower (legs,  knees, ankles, toes): None, normal, Trunk Movements Neck, shoulders, hips: None, normal, Overall Severity Severity of abnormal movements (highest score from questions above): None, normal Incapacitation due to abnormal movements: None, normal Patient's awareness of abnormal movements (rate only patient's report): No Awareness, Dental Status Current problems with teeth and/or dentures?: No Does patient usually wear dentures?: No  CIWA:    COWS:     Treatment Plan Summary: Daily contact with patient to assess and evaluate symptoms and progress in treatment Medication management  Plan:1. Admit for crisis management and stabilization. 2. Medication management to reduce current symptoms to base line and improve the     patient's overall level of functioning 3. Treat health problems as indicated. 4. Develop treatment plan to decrease risk of relapse upon discharge and the need for     readmission. 5. Psycho-social education regarding relapse prevention and self care. 6. Health care follow up as needed for medical problems. 7. Restart home medications where appropriate.   Medical Decision Making Problem Points:  Established problem, slightly improving (1), Review of last therapy session (1) and Review of psycho-social stressors (1) Data Points:  Order Aims Assessment (2) Review of medication regiment & side effects (2) Review of new medications or change in dosage (2)  I certify that inpatient services furnished can reasonably be expected to improve the patient's condition.   Thedore Mins, MD 06/12/2013, 10:08 AM

## 2013-06-12 NOTE — Tx Team (Signed)
  Interdisciplinary Treatment Plan Update   Date Reviewed:  06/12/2013  Time Reviewed:  8:22 AM  Progress in Treatment:   Attending groups: Yes Participating in groups: Yes Taking medication as prescribed: Yes  Tolerating medication: Yes Family/Significant other contact made: Yes  Patient understands diagnosis: Yes  Discussing patient identified problems/goals with staff: Yes Medical problems stabilized or resolved: Yes Denies suicidal/homicidal ideation: Yes Patient has not harmed self or others: Yes  For review of initial/current patient goals, please see plan of care.  Estimated Length of Stay:  2-3 days  Reason for Continuation of Hospitalization: Hallucinations Medication stabilization  New Problems/Goals identified:  N/A  Discharge Plan or Barriers:   return home, follow up outpt  Additional Comments:  Gerald Ballard is more engaged and spontaneous.  His prolixin was cut back a bit today due to sedation.   Attendees:  Signature: Thedore Mins, MD 06/12/2013 8:22 AM   Signature: Richelle Ito, LCSW 06/12/2013 8:22 AM  Signature: Fransisca Kaufmann, NP 06/12/2013 8:22 AM  Signature: Joslyn Devon, RN 06/12/2013 8:22 AM  Signature: Liborio Nixon, RN 06/12/2013 8:22 AM  Signature:  06/12/2013 8:22 AM  Signature:   06/12/2013 8:22 AM  Signature:    Signature:    Signature:    Signature:    Signature:    Signature:      Scribe for Treatment Team:   Richelle Ito, LCSW  06/12/2013 8:22 AM

## 2013-06-12 NOTE — Progress Notes (Signed)
Seen and agreed. Sweet Jarvis, MD 

## 2013-06-12 NOTE — Progress Notes (Signed)
Patient ID: Gerald Ballard, male   DOB: 10-09-1980, 32 y.o.   MRN: [id number] D: Pt. Lying in bed, but gets up at writers request. Pt. Denies SHI, AVH, but notably suspicious and watchful mood, but cooperative. A: Writer introduced self to client and encouraged group. Writer encouraged pt. To verbalize any concerns. Staff will monitor q41min for safety. R: Pt. Is safe on the unit and attended group. Pt. Reports not concerns at this time.

## 2013-06-13 MED ORDER — FLUOXETINE HCL 10 MG PO CAPS
30.0000 mg | ORAL_CAPSULE | Freq: Every day | ORAL | Status: DC
  Administered 2013-06-14 – 2013-06-16 (×3): 30 mg via ORAL
  Filled 2013-06-13: qty 42
  Filled 2013-06-13 (×5): qty 3

## 2013-06-13 NOTE — Progress Notes (Signed)
Adult Psychoeducational Group Note  Date:  06/13/2013 Time:  8:00 pm  Group Topic/Focus:  Wrap-Up Group:   The focus of this group is to help patients review their daily goal of treatment and discuss progress on daily workbooks.  Participation Level:  Active  Participation Quality:  Appropriate and Sharing  Affect:  Appropriate  Cognitive:  Appropriate  Insight: Appropriate  Engagement in Group:  Engaged  Modes of Intervention:  Discussion, Education and Socialization  Additional Comments:  Pt stated that he was having thoughts of harming himself before checking into the hospital. Pt stated that he enjoys playing outside and working.   Laural Benes, Zackary Mckeone 06/13/2013, 11:26 PM

## 2013-06-13 NOTE — Progress Notes (Signed)
Seen and agreed. Landyn Lorincz, MD 

## 2013-06-13 NOTE — [facility] Group Notes (Signed)
[facility] LCSW Group Therapy  06/13/2013 , 12:34 PM   Type of Therapy:  Group Therapy  Participation Level:  Minimal  Participation Quality:  Attentive  Affect:  Appropriate  Cognitive:  Alert  Insight:  None  Engagement in Therapy: None  Modes of Intervention:  Discussion, Exploration and Socialization  Summary of Progress/Problems: Today's group focused on the term Diagnosis.  Participants were asked to define the term, and then pronounce whether it is a negative, positive or neutral term.  Latravis sat quietly and attentively throughout.  When asked if he has someone who loves him unconditionally, he talked about his sister in [place] with whom he got to speak to by phone 2 months ago.  [name] 06/13/2013 , 12:34 PM

## 2013-06-13 NOTE — Progress Notes (Signed)
Adult Psychoeducational Group Note  Date:  06/13/2013 Time:  10:44 AM  Group Topic/Focus:  Recovery Goals:   The focus of this group is to identify appropriate goals for recovery and establish a plan to achieve them.  Participation Level:  Minimal  Participation Quality:  Appropriate and Attentive  Affect:  Appropriate  Cognitive:  Appropriate  Insight: Improving  Engagement in Group:  Developing/Improving  Modes of Intervention:  Discussion, Education, Socialization and Support  Additional Comments:  Krew attended group and shared during. Patient was asked to share what recovery looks like in patient life, and list a change in workbook that could be made in to stay in a stage of recovery. Patient made a goal for recovery that could be managed and attainable.   [name] 06/13/2013, 10:44 AM

## 2013-06-13 NOTE — Progress Notes (Signed)
Patient ID: Gerald Ballard, male   DOB: October 05, 1980, 32 y.o.   MRN: [id number]   [facility] MD Progress Note  06/13/2013 10:51 AM Gerald Ballard  MRN:  [id number] Subjective: Patient states "I'm doing fine. Nothing is going on with me."   Objective:  Patient continues to minimize his symptoms to Provider. Gerald Ballard will deny having any active psychiatric symptoms when asked. However, he has been reporting ongoing auditory hallucinations, paranoia, and depression to nursing staff. Patient is active on the unit and has been compliant with his medications. The case manager reported that the patient displayed inappropriate laughter during group after being asked a simple question.   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   HENT: Negative.   Eyes: Negative.   Respiratory: Negative.   Cardiovascular: Negative.   Gastrointestinal: Negative.   Genitourinary: Negative.   Musculoskeletal: Negative.   Skin: Negative.   Neurological: Negative.   Endo/Heme/Allergies: Negative.   Psychiatric/Behavioral: Positive for depression and hallucinations. Negative for suicidal ideas, memory loss and substance abuse. The patient is nervous/anxious. The patient does not have insomnia.     Blood pressure 120/86, pulse 101, temperature 97.9 F (36.6 C), temperature source Oral, resp. rate 18, height 5' 8.5" (1.74 m), weight 57.153 kg (126 lb).Body mass index is 18.88 kg/(m^2).  General Appearance: Disheveled   Eye Contact::  Minimal  Speech:  Slow  Volume:  Decreased  Mood:  Anxious and Depressed  Affect:  Flat  Thought Process:  Disorganized  Orientation:  Full (Time, Place, and Person)  Thought Content:  Delusions and Hallucinations: Auditory  Suicidal Thoughts:  No  Homicidal Thoughts:  No  Memory:  Immediate;   Fair Recent;   Fair Remote;   Fair  Judgement:  Fair  Insight:  Fair  Psychomotor Activity:  Decreased  Concentration:  Fair  Recall:  Fair  Akathisia:  No  Handed:  Right  AIMS (if indicated):     Assets:  Communication Skills Desire for Improvement Physical Health  Sleep:  Number of Hours: 6.75   Current Medications: Current Facility-Administered Medications  Medication Dose Route Frequency Provider Last Rate Last Dose  . acetaminophen (TYLENOL) tablet 650 mg  650 mg Oral Q6H PRN Shuvon Rankin, NP      . alum & mag hydroxide-simeth (MAALOX/MYLANTA) 200-200-20 MG/5ML suspension 30 mL  30 mL Oral Q4H PRN Shuvon Rankin, NP      . benztropine (COGENTIN) tablet 1 mg  1 mg Oral BID Shuvon Rankin, NP   1 mg at 06/13/13 0757  . feeding supplement (ENSURE COMPLETE) (ENSURE COMPLETE) liquid 237 mL  237 mL Oral TID BM Lavena Bullion, RD   237 mL at 06/12/13 1958  . FLUoxetine (PROZAC) capsule 20 mg  20 mg Oral Daily Mojeed Akintayo   20 mg at 06/13/13 0757  . fluPHENAZine (PROLIXIN) tablet 10 mg  10 mg Oral BID PC Mojeed Akintayo   10 mg at 06/13/13 0756  . magnesium hydroxide (MILK OF MAGNESIA) suspension 30 mL  30 mL Oral Daily PRN Shuvon Rankin, NP      .  OLANZapine zydis (ZYPREXA) disintegrating tablet 5 mg  5 mg Oral Q8H PRN Fransisca Kaufmann, NP      . traZODone (DESYREL) tablet 50 mg  50 mg Oral QHS Mojeed Akintayo   50 mg at 06/12/13 2145    Lab Results: No results found for this or any previous visit (from the past 48 hour(s)).  Physical Findings: AIMS: Facial and Oral Movements Muscles of Facial Expression: None, normal Lips and Perioral Area: None, normal Jaw: None,  normal Tongue: None, normal,Extremity Movements Upper (arms, wrists, hands, fingers): None, normal Lower (legs, knees, ankles, toes): None, normal, Trunk Movements Neck, shoulders, hips: None, normal, Overall Severity Severity of abnormal movements (highest score from questions above): None, normal Incapacitation due to abnormal movements: None, normal Patient's awareness of abnormal movements (rate only patient's report): No Awareness, Dental Status Current problems with teeth and/or dentures?: No Does patient usually wear dentures?: No  CIWA:    COWS:     Treatment Plan Summary: Daily contact with patient to assess and evaluate symptoms and progress in treatment Medication management  Plan:1. Continue crisis management and stabilization. 2. Medication management to reduce current symptoms to base line and improve the patient's overall level of functioning. Continue Prolixin to 10 mg BID for psychosis. Increase Prozac to 30 mg for depressive symptoms.  3. Treat health problems as indicated. 4. Develop treatment plan to decrease risk of relapse upon discharge and the need for readmission. 5. Psycho-social education regarding relapse prevention and self care. 6. Health care follow up as needed for medical problems. 7. Restart home medications where appropriate.  Medical Decision Making Problem Points:  Established problem, slightly improving (1), Review of last therapy session (1) and Review of psycho-social stressors (1) Data Points:  Order Aims Assessment (2) Review of medication regiment & side effects (2) Review of new medications or change in dosage (2)  I certify that inpatient services furnished can reasonably be expected to improve the patient's condition.   Fransisca Kaufmann, NP-C 06/13/2013, 10:51 AM

## 2013-06-13 NOTE — Progress Notes (Signed)
D: Pt denies SI/HI/AVH. Pt is pleasant and cooperative. Pt stated he would like to stay on the medications when he leaves. Pt states "everything is fine". Pt continues to forward little and minimize his situation.    A: Pt was offered support and encouragement. Pt was given scheduled medications. Pt was encourage to attend groups. Q 15 minute checks were done for safety.    R:Pt attends groups . Pt is taking medication. Pt has no complaints at this time.Pt receptive to treatment and safety maintained on unit.

## 2013-06-13 NOTE — Progress Notes (Signed)
D: Pt denies SI/HI/AVH. Pt forwards little information, guarded and withdrawn today. Pt presents with an anxious mood and poor insight. Pt has minimal interaction on the milieu and needs encouragement to participate in activities and groups. No complaints verbalized by pt at this time. Pt compliant with taking meds.  A: Medications administered as ordered per MD. Verbal support given. Pt encouraged to attend groups. 15 minute checks performed for safety. R: Pt appears cautious and watchful.  Pt safety maintained.

## 2013-06-14 DIAGNOSIS — F22 Delusional disorders: Secondary | ICD-10-CM

## 2013-06-14 DIAGNOSIS — F23 Brief psychotic disorder: Secondary | ICD-10-CM

## 2013-06-14 NOTE — Progress Notes (Signed)
Adult Psychoeducational Group Note  Date:  06/14/2013 Time:  11:00AM Group Topic/Focus:  personal devlopment  Participation Level:  Did Not Attend  Additional Comments: PT. Didn't attend group.   Bing Plume D 06/14/2013, 4:16 PM

## 2013-06-14 NOTE — Progress Notes (Signed)
Patient ID: Gerald Ballard, male   DOB: 26-Oct-1980, 32 y.o.   MRN: [id number] D: Pt. Lying in bed, up for meds, no interaction with other clients, isolates. A: Writer encouraged group and conversation. Staff monitors q26min for safety. R: Pt. Provides minimum conversation. Pt. Does not attends group.Pt. Is safe on the unit.

## 2013-06-14 NOTE — B[facility] Group Notes (Signed)
[facility] LCSW Aftercare Discharge Planning Group Note   06/14/2013 11:06 AM  Participation Quality:  Distracted  Mood/Affect:  Blunted  Depression Rating:  denies  Anxiety Rating:  denies  Thoughts of Suicide:  No Will you contract for safety?   NA  Current AVH:  Denies, but appears to be RIS  Plan for Discharge/Comments:   Gerald Ballard is happy because he was told he would be leaving the hospital in 2 days.  He periodically began laughing out loud as others were talking.  This was not prompted by any funny statements.  Transportation Means: bus  Supports: aunt and uncle  [name]

## 2013-06-14 NOTE — Progress Notes (Signed)
D: Pt denies SI/HI/AVH today. Pt presents anxious and cheerful today. Pt rates depression 1/10. No complaints verbalized by pt today. Pt more alert today and requires less redirecting by staff. Pt compliant with taking meds and attending groups. No adverse reaction to meds verbalized by pt. A: Medications administered as ordered per MD. Verbal support given. Pt encouraged to attend groups. 15 minute checks performed for safety. R: Pt safety maintained.

## 2013-06-14 NOTE — Progress Notes (Signed)
Patient ID: Gerald Ballard, male   DOB: Aug 06, 1980, 32 y.o.   MRN: [id number]                                                                   MD Progress Note  06/14/2013 9:51 AM Gerald Ballard  MRN:  [id number] Subjective: "I think my medication has started working for me." Objective: Patient reports decreased paranoid ideations, anxiety and depressive symptoms. However, he reports intermittent auditory hallucinations but he is  compliant with his medications and did not report any adverse reactions to it. He denies visual hallucinations, suicidal or homicidal ideation, intent or plan. 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   HENT: Negative.   Eyes: Negative.   Respiratory: Negative.   Cardiovascular: Negative.   Gastrointestinal: Negative.   Genitourinary: Negative.   Musculoskeletal: Negative.   Skin: Negative.   Neurological: Negative.   Endo/Heme/Allergies: Negative.   Psychiatric/Behavioral: Positive for depression and hallucinations. The patient is nervous/anxious.     Blood pressure 124/84, pulse 97, temperature 98.1 F (36.7 C), temperature source Oral, resp. rate 20, height 5' 8.5" (1.74 m), weight 57.153 kg (126 lb).Body mass index is 18.88 kg/(m^2).  General Appearance: fairly groomed  Patent attorney::  Minimal  Speech:  Slow  Volume:  Decreased  Mood:  dysphoric  Affect:  Flat  Thought Process: circumstantial  Orientation:  Full (Time, Place, and Person)   Thought Content:  Delusions and Hallucinations  Suicidal Thoughts:  No  Homicidal Thoughts:  No  Memory:  Immediate;   Fair Recent;   Fair Remote;   Fair  Judgement:  Fair  Insight:  Fair  Psychomotor Activity:  Decreased  Concentration:  Fair  Recall:  Fair  Akathisia:  No  Handed:  Right  AIMS (if indicated):     Assets:  Communication Skills Desire for Improvement Physical Health  Sleep:  Number of Hours: 6   Current Medications: Current Facility-Administered Medications  Medication Dose Route Frequency Provider Last Rate Last Dose  . acetaminophen (TYLENOL) tablet 650 mg  650 mg Oral Q6H PRN Shuvon Rankin, NP      . alum & mag hydroxide-simeth (MAALOX/MYLANTA) 200-200-20 MG/5ML suspension 30 mL  30 mL Oral Q4H PRN Shuvon Rankin, NP      . benztropine (COGENTIN) tablet 1 mg  1 mg Oral BID Shuvon Rankin, NP   1 mg at 06/14/13 0744  . feeding supplement (ENSURE COMPLETE) (ENSURE COMPLETE) liquid 237 mL  237 mL Oral TID BM Lavena Bullion, RD   237 mL at 06/14/13 0744  . FLUoxetine (PROZAC) capsule 30 mg  30 mg Oral Daily Fransisca Kaufmann, NP   30 mg at 06/14/13 0744  . fluPHENAZine (PROLIXIN) tablet 10 mg  10 mg Oral BID PC Abdulah Iqbal   10 mg at 06/14/13 0744  . magnesium hydroxide (MILK OF  MAGNESIA) suspension 30 mL  30 mL Oral Daily PRN Shuvon Rankin, NP      . OLANZapine zydis (ZYPREXA) disintegrating tablet 5 mg  5 mg Oral Q8H PRN Fransisca Kaufmann, NP      . traZODone (DESYREL) tablet 50 mg  50 mg Oral QHS Jcion Buddenhagen   50 mg at 06/12/13 2145    Lab Results: No results found for this or any previous visit (from the past 48 hour(s)).  Physical Findings: AIMS: Facial and Oral Movements Muscles of Facial Expression: None, normal Lips and Perioral Area: None, normal Jaw: None, normal Tongue: None, normal,Extremity Movements Upper (arms, wrists, hands, fingers): None, normal Lower (legs, knees, ankles, toes): None, normal, Trunk Movements Neck, shoulders, hips: None,  normal, Overall Severity Severity of abnormal movements (highest score from questions above): None, normal Incapacitation due to abnormal movements: None, normal Patient's awareness of abnormal movements (rate only patient's report): No Awareness, Dental Status Current problems with teeth and/or dentures?: No Does patient usually wear dentures?: No  CIWA:    COWS:     Treatment Plan Summary: Daily contact with patient to assess and evaluate symptoms and progress in treatment Medication management  Plan:1. Admit for crisis management and stabilization. 2. Medication management to reduce current symptoms to base line and improve the     patient's overall level of functioning 3. Treat health problems as indicated. 4. Develop treatment plan to decrease risk of relapse upon discharge and the need for     readmission. 5. Psycho-social education regarding relapse prevention and self care. 6. Health care follow up as needed for medical problems. 7. Restart home medications where appropriate. 8. Continue Prolixin 10mg  po bid for delusions and psychosis 9. Continue Prozac 30mg  po daily for depression 10. Cogentin 1mg  po BID for EPS prevention.   Medical Decision Making Problem Points:  Established problem, slightly improving (1), Review of last therapy session (1) and Review of psycho-social stressors (1) Data Points:  Order Aims Assessment (2) Review of medication regiment & side effects (2) Review of new medications or change in dosage (2)  I certify that inpatient services furnished can reasonably be expected to improve the patient's condition.   Thedore Mins, MD 06/14/2013, 9:51 AM

## 2013-06-14 NOTE — [facility] Group Notes (Signed)
[facility] Group Therapy  06/14/2013  1:02 PM  Type of Therapy:  [employer] Presentation   Participation Level:  Minimal  Participation Quality:  Attentive  Affect:  Appropriate  Cognitive:  Appropriate  Insight:  Improving  Engagement in Therapy:  Lacking  Modes of Intervention:  Discussion, Education and Socialization   Summary of Progress/Problems:  [name] from [employer] came to present his recovery story and play the guitar.  [name] listened quietly as the speaker told his story and played his music.  He did not engage with the speaker.    Gerald Ballard   06/14/2013  1:02 PM

## 2013-06-15 NOTE — Progress Notes (Signed)
Recreation Therapy Notes  Date: 11.19.2014 Time: 9:30am Location: [address] Dayroom  Group Topic: Communication  Goal Area(s) Addresses:  Patient will effectively communicate with peers in group.  Patient will verbalize benefit of healthy communication. Patient will identify communication techniques that made activity effective for group.   Behavioral Response: Appropriate   Intervention: Game  Activity: Random Words. Patients were asked to select words from provided container, using 5 words or less patient was asked to describe selected word for group members to guess.    Education: Special educational needs teacher, Building control surveyor.    Education Outcome: Acknowledges understanding   Clinical Observations/Feedback: Patient actively engaged in activity, accurately describing words for peers to guess. Patient made no contributions to group discussion, but appeared to actively listen as he maintained appropriate eye contact with speaker.    Marykay Lex Mattox Schorr, LRT/CTRS  Chloeanne Poteet L 06/15/2013 9:25 AM

## 2013-06-15 NOTE — Tx Team (Signed)
  Interdisciplinary Treatment Plan Update   Date Reviewed:  06/15/2013  Time Reviewed:  10:54 AM  Progress in Treatment:   Attending groups: Yes Participating in groups: Yes Taking medication as prescribed: Yes  Tolerating medication: Yes Family/Significant other contact made: Yes  Patient understands diagnosis: Yes  Discussing patient identified problems/goals with staff: Yes Medical problems stabilized or resolved: Yes Denies suicidal/homicidal ideation: Yes Patient has not harmed self or others: Yes  For review of initial/current patient goals, please see plan of care.  Estimated Length of Stay: Likely D/C tomorrow  Reason for Continuation of Hospitalization:   New Problems/Goals identified:  N/A  Discharge Plan or Barriers:   return home, follow up outpt  Additional Comments:  Attendees:  Signature: Thedore Mins, MD 06/15/2013 10:54 AM   Signature: Richelle Ito, LCSW 06/15/2013 10:54 AM  Signature: Fransisca Kaufmann, NP 06/15/2013 10:54 AM  Signature: Joslyn Devon, RN 06/15/2013 10:54 AM  Signature: Liborio Nixon, RN 06/15/2013 10:54 AM  Signature:  06/15/2013 10:54 AM  Signature:   06/15/2013 10:54 AM  Signature:    Signature:    Signature:    Signature:    Signature:    Signature:      Scribe for Treatment Team:   Richelle Ito, LCSW  06/15/2013 10:54 AM

## 2013-06-15 NOTE — Progress Notes (Signed)
Patient ID: Gerald Ballard, male   DOB: 06-07-1981, 32 y.o.   MRN: [id number]                                                                    MD Progress Note  06/15/2013 11:39 AM Gerald Ballard  MRN:  [id number] Subjective: Patient states "I am feeling good on these medications. I am not feeling unsafe anymore."   Objective: Patient reports decreased paranoid ideations, anxiety and depressive symptoms. He reports that he is no longer hearing voices or feeling paranoid. Patient verbalizes a willingness to stay on his medications and to be followed by a transitional care team after discharge.   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   HENT: Negative.   Eyes: Negative.   Respiratory: Negative.   Cardiovascular: Negative.   Gastrointestinal: Negative.   Genitourinary: Negative.   Musculoskeletal: Negative.   Skin: Negative.   Neurological: Negative.   Endo/Heme/Allergies: Negative.   Psychiatric/Behavioral: Positive for depression. Negative for suicidal ideas, hallucinations, memory loss and substance abuse. The patient is not nervous/anxious and does not have insomnia.     Blood pressure 120/73, pulse 90, temperature 98 F (36.7 C), temperature source Oral, resp. rate 18, height 5' 8.5" (1.74 m), weight 57.153 kg (126 lb).Body mass index is 18.88 kg/(m^2).  General Appearance: fairly groomed  Patent attorney::  Minimal  Speech:  Slow  Volume:  Decreased  Mood:   dysphoric  Affect:  Flat  Thought Process: circumstantial  Orientation:  Full (Time, Place, and Person)  Thought Content:  WNL  Suicidal Thoughts:  No  Homicidal Thoughts:  No  Memory:  Immediate;   Fair Recent;   Fair Remote;   Fair  Judgement:  Fair  Insight:  Fair  Psychomotor Activity:  Decreased  Concentration:  Fair  Recall:  Fair  Akathisia:  No  Handed:  Right  AIMS (if indicated):     Assets:  Communication Skills Desire for Improvement Physical Health  Sleep:  Number of Hours: 6.5   Current Medications: Current Facility-Administered Medications  Medication Dose Route Frequency Provider Last Rate Last Dose  . acetaminophen (TYLENOL) tablet 650 mg  650 mg Oral Q6H PRN Shuvon Rankin, NP      . alum & mag hydroxide-simeth (MAALOX/MYLANTA) 200-200-20 MG/5ML suspension 30 mL  30 mL Oral Q4H PRN Shuvon Rankin, NP      . benztropine (COGENTIN) tablet 1 mg  1 mg Oral BID Shuvon Rankin, NP   1 mg at 06/15/13 0832  . feeding supplement (ENSURE COMPLETE) (ENSURE COMPLETE) liquid 237 mL  237 mL Oral TID BM Lavena Bullion, RD   237 mL at 06/15/13 1004  . FLUoxetine (PROZAC) capsule 30 mg  30 mg Oral Daily Fransisca Kaufmann, NP   30 mg at 06/15/13 8469  . fluPHENAZine (PROLIXIN) tablet 10 mg  10 mg Oral BID PC Mojeed Akintayo   10 mg at 06/15/13 1610  . magnesium hydroxide (MILK OF MAGNESIA) suspension 30 mL  30 mL Oral Daily PRN Shuvon Rankin, NP      . OLANZapine zydis (ZYPREXA) disintegrating tablet 5 mg  5 mg Oral Q8H PRN Fransisca Kaufmann, NP      . traZODone (DESYREL) tablet 50 mg  50 mg Oral QHS Mojeed Akintayo   50 mg at 06/14/13 2124    Lab Results: No results found for this or any previous visit (from the past 48 hour(s)).  Physical Findings: AIMS: Facial and Oral Movements Muscles of Facial Expression: None, normal Lips and Perioral Area: None, normal Jaw: None, normal Tongue: None, normal,Extremity Movements Upper (arms, wrists, hands, fingers): None, normal Lower (legs,  knees, ankles, toes): None, normal, Trunk Movements Neck, shoulders, hips: None, normal, Overall Severity Severity of abnormal movements (highest score from questions above): None, normal Incapacitation due to abnormal movements: None, normal Patient's awareness of abnormal movements (rate only patient's report): No Awareness, Dental Status Current problems with teeth and/or dentures?: No Does patient usually wear dentures?: No  CIWA:  CIWA-Ar Total: 0 COWS:  COWS Total Score: 1  Treatment Plan Summary: Daily contact with patient to assess and evaluate symptoms and progress in treatment Medication management  Plan:1. Continue crisis management and stabilization. 2. Medication management to reduce current symptoms to base line and improve the patient's overall level of functioning 3. Treat health problems as indicated. 4. Develop treatment plan to decrease risk of relapse upon discharge and the need for readmission. 5. Psycho-social education regarding relapse prevention and self care. 6. Health care follow up as needed for medical problems. 7. Anticipate d/c tomorrow.  8. Continue Prolixin 10mg  po bid for delusions and psychosis 9. Continue Prozac 30mg  po daily for depression 10. Cogentin 1mg  po BID for EPS prevention.  Medical Decision Making Problem Points:  Established problem, slightly improving (1), Review of last therapy session (1) and Review of psycho-social stressors (1) Data Points:  Order Aims Assessment (2) Review of medication regiment & side effects (2) Review of new medications or change in dosage (2)  I certify that inpatient services furnished can reasonably be expected to improve the patient's condition.   Fransisca Kaufmann, NP-C 06/15/2013, 11:39 AM

## 2013-06-15 NOTE — Progress Notes (Signed)
Patient ID: Gerald Ballard, male   DOB: 23-Jul-1981, 32 y.o.   MRN: [id number] D: Pt. Visible on the unit, affect brighter, occasional smile, reports voices have decreased. "day was okay, I been taking medicine" A: Writer provided emotional support, encouraged group."I like R&B music" Staff will monitor q30min for safety. R: Pt. Is safe on the unit. Pt. Attended karaoke.

## 2013-06-15 NOTE — [facility] Group Notes (Signed)
[facility] Group Notes:  (Counselor/Nursing/MHT/Case Management/Adjunct)  06/15/2013 1:15PM  Type of Therapy:  Group Therapy  Participation Level:  Active  Participation Quality:  Appropriate  Affect:  Flat  Cognitive:  Oriented  Insight:  Improving  Engagement in Group:  Limited  Engagement in Therapy:  Limited  Modes of Intervention:  Discussion, Exploration and Socialization  Summary of Progress/Problems: The topic for group was balance in life.  Pt participated in the discussion about when their life was in balance and out of balance and how this feels.  Pt discussed ways to get back in balance and short term goals they can work on to get where they want to be.  " I am passive.  I want to learn from others."  Other than these comments, [name] sat quietly throughout the group except for the couple of times he began laughing uncontrollably, per usual.   [name] 06/15/2013 1:28 PM

## 2013-06-15 NOTE — Progress Notes (Signed)
D:  Patient's self inventory sheet, patient sleeps well, good appetite, normal energy level, good attention span.  Rated depression and hopeless #1.  Denied withdrawals.  Denied SI.  Denied physical problems.  Pain goal today #1.  After discharge, plans to take his medications and visit home health care doctor.  Does have discharge plans.  No problems taking meds after discharge. A:  Medications administered per MD orders.  Emotional support and encouragement given patient. R:   Patient denied SI and HI.  Denied A/V hallucinations.  Denied pain.  Will continue to monitor patient for safety with 15 minute checks.  Safety maintained.

## 2013-06-16 MED ORDER — FLUOXETINE HCL 10 MG PO CAPS: 30.0000 mg | ORAL_CAPSULE | Freq: Every day | ORAL | Status: AC

## 2013-06-16 MED ORDER — BENZTROPINE MESYLATE 1 MG PO TABS: 1.0000 mg | ORAL_TABLET | Freq: Two times a day (BID) | ORAL | Status: AC

## 2013-06-16 MED ORDER — LISINOPRIL 10 MG PO TABS: 10.0000 mg | ORAL_TABLET | Freq: Every day | ORAL | Status: AC

## 2013-06-16 MED ORDER — FLUPHENAZINE HCL 10 MG PO TABS: 10.0000 mg | ORAL_TABLET | Freq: Two times a day (BID) | ORAL | Status: AC

## 2013-06-16 MED ORDER — TRAZODONE HCL 50 MG PO TABS: 50.0000 mg | ORAL_TABLET | Freq: Every day | ORAL | Status: AC

## 2013-06-16 NOTE — Discharge Summary (Signed)
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   HENT: Negative.   Eyes: Negative.   Respiratory: Negative.   Cardiovascular: Negative.   Gastrointestinal: Negative.   Genitourinary: Negative.   Musculoskeletal: Negative.   Skin: Negative.   Neurological: Negative.   Endo/Heme/Allergies: Negative.   Psychiatric/Behavioral: Negative for depression, suicidal ideas, hallucinations, memory loss and substance abuse. The patient is not nervous/anxious and does not have insomnia.     DSM5: AXIS I: Schizoaffective disorder, unspecified condition  AXIS II: Deferred  AXIS III:  Past Medical History   Diagnosis  Date   .  No pertinent past medical history    .  Mental disorder     AXIS IV: other psychosocial or environmental problems and problems related to social environment  AXIS V: 61-70 mild symptoms   Level of Care:  OP  Hospital Course:   Gerald Ballard is an 32 y/o male, immigrant from [place], [place] who was brought to the [facility] via EMS voluntarily expressing SI with intent to harm self. The patient has been hearing command voices telling him that he is crazy and will die according to the [facility] assessment note dated 06/06/13. The patient is a very poor historian due to his current psychotic state. Patient is very vague in his statements "I wanted treatment." When probed further he stated "I want to clear some things up in my mind. I have been having crazy thoughts for a few months. I want to think  clear so I can have a relationship. I took my medications until they were finished last time. I did not have the money to continue with them. I get money from my Uncle." The patient presents with thought blocking and appears to be responding to internal stimuli. Gerald Ballard is hard to understand and gave conflicting answer to many questions which required much clarification on writer's part. He received inpatient services from [facility] in January 2014 on the [address] hall.          Gerald Ballard was admitted to the adult unit. He was evaluated and his symptoms were identified. Medication management was discussed and initiated. Patient was restarted on his prior psychiatric medications of Prozac and Prolixin. Patient was compliant with his medications.  He was oriented to the unit and encouraged to participate in unit programming. Medical problems were identified and treated appropriately. Home medication was restarted as needed.        The patient was evaluated each day by a clinical provider to ascertain the patient's response to treatment. He began to report a slow decrease in his auditory hallucinations. Improvement was noted by the patient's report of decreasing symptoms, improved sleep and appetite, affect, medication tolerance, behavior, and participation in unit programming.  Gerald Ballard was asked each day to complete a self inventory noting mood, mental status, pain, new symptoms, anxiety and concerns.         He responded well to medication and being in a therapeutic and supportive environment. Positive and appropriate behavior was noted and the patient was motivated for  recovery.  Gerald Ballard worked closely with the treatment team and case manager to develop a discharge plan with appropriate goals. Coping skills, problem solving as well as relaxation therapies were also part of the unit programming.         By the day of discharge Gerald Ballard  was in much improved condition than upon admission.  Symptoms were reported as significantly  decreased or resolved completely.  The patient denied SI/HI and voiced no AVH. He was motivated to continue taking medication with a goal of continued improvement in mental health.          Gerald Ballard was discharged home with a plan to follow up as noted below.  Consults:  None  Significant Diagnostic Studies:  labs: Admission labs reviewed   Discharge Vitals:   Blood pressure 112/82, pulse 102, temperature 98.3 F (36.8 C), temperature source Oral, resp. rate 17, height 5' 8.5" (1.74 m), weight 57.153 kg (126 lb). Body mass index is 18.88 kg/(m^2). Lab Results:   No results found for this or any previous visit (from the past 72 hour(s)).  Physical Findings: AIMS: Facial and Oral Movements Muscles of Facial Expression: None, normal Lips and Perioral Area: None, normal Jaw: None, normal Tongue: None, normal,Extremity Movements Upper (arms, wrists, hands, fingers): None, normal Lower (legs, knees, ankles, toes): None, normal, Trunk Movements Neck, shoulders, hips: None, normal, Overall Severity Severity of abnormal movements (highest score from questions above): None, normal Incapacitation due to abnormal movements: None, normal Patient's awareness of abnormal movements (rate only patient's report): No Awareness, Dental Status Current problems with teeth and/or dentures?: No Does patient usually wear dentures?: No  CIWA:  CIWA-Ar Total: 0 COWS:  COWS Total Score: 1  Psychiatric Specialty Exam: See Psychiatric Specialty Exam and Suicide Risk Assessment completed by Attending Physician prior to discharge.  Discharge destination:  Home  Is patient on multiple antipsychotic therapies at discharge:  No   Has Patient had three or more failed trials of antipsychotic monotherapy by history:  No  Recommended Plan for Multiple Antipsychotic Therapies: NA     Medication List    STOP taking these medications       LORazepam 2 MG tablet  Commonly known as:  ATIVAN     mirtazapine  30 MG disintegrating tablet  Commonly known as:  REMERON SOL-TAB      TAKE these medications     Indication   benztropine 1 MG tablet  Commonly known as:  COGENTIN  Take 1 tablet (1 mg total) by mouth 2 (two) times daily.   Indication:  Extrapyramidal Reaction caused by Medications     FLUoxetine 10 MG capsule  Commonly known as:  PROZAC  Take 3 capsules (30 mg total) by mouth daily.   Indication:  Depression     fluPHENAZine 10 MG tablet  Commonly known as:  PROLIXIN  Take 1 tablet (10 mg total) by mouth 2 (two) times daily after a meal.   Indication:  Psychosis     lisinopril 10 MG tablet  Commonly known as:  PRINIVIL,ZESTRIL  Take 1 tablet (10 mg total) by mouth daily. For hypertension.   Indication:  High Blood Pressure     traZODone 50 MG tablet  Commonly known as:  DESYREL  Take 1 tablet (50 mg total) by mouth at bedtime.   Indication:  Trouble Sleeping           Follow-up Information   Follow up with [facility]. (Go to the walk-in clinic Tues.-Fri  at 9AM to see Dr Erlinda Hong for your hospital follow up appointment)    Contact information:   [address]  [place]  [phone]      Follow-up recommendations:   Activity: as tolerated  Diet: healthy  Tests: routine  Other: patient to keep his after care appointment   Comments:    Take all your medications as prescribed by your mental healthcare provider.  Report any adverse effects and or reactions from your medicines to your outpatient provider promptly.  Patient is instructed and cautioned to not engage in alcohol and or illegal drug use while on prescription medicines.  In the event of worsening symptoms, patient is instructed to call the crisis hotline, 911 and or go to the nearest ED for appropriate evaluation and treatment of symptoms.  Follow-up with your primary care provider for your other medical issues, concerns and or health care needs.   Total Discharge Time:  Greater than 30  minutes.  SignedFransisca Kaufmann NP-C 06/16/2013, 10:12 AM

## 2013-06-16 NOTE — [facility] Suicide Risk Assessment (Signed)
Suicide Risk Assessment  Discharge Assessment     Demographic Factors:  Male, Low socioeconomic status, Living alone and Unemployed  Mental Status Per Nursing Assessment::   On Admission:     Current Mental Status by Physician: patient denies suicidal ideation, intent or plan  Loss Factors: Financial problems/change in socioeconomic status  Historical Factors: Impulsivity  Risk Reduction Factors:   Living with another person, especially a relative and Positive social support  Continued Clinical Symptoms:  Resolving psychosis  Cognitive Features That Contribute To Risk:  Closed-mindedness Polarized thinking    Suicide Risk:  Minimal: No identifiable suicidal ideation.  Patients presenting with no risk factors but with morbid ruminations; may be classified as minimal risk based on the severity of the depressive symptoms  Discharge Diagnoses:   AXIS I:  Schizoaffective disorder, unspecified condition  AXIS II:  Deferred AXIS III:   Past Medical History  Diagnosis Date  . No pertinent past medical history   . Mental disorder    AXIS IV:  other psychosocial or environmental problems and problems related to social environment AXIS V:  61-70 mild symptoms  Plan Of Care/Follow-up recommendations:  Activity:  as tolerated Diet:  healthy Tests:  routine Other:  patient to keep his after care appointment  Is patient on multiple antipsychotic therapies at discharge:  No   Has Patient had three or more failed trials of antipsychotic monotherapy by history:  No  Recommended Plan for Multiple Antipsychotic Therapies: NA  Thedore Mins, MD 06/16/2013, 9:57 AM

## 2013-06-16 NOTE — Progress Notes (Signed)
Recreation Therapy Notes  Date: 11.21.2014 Time: 9:30am Location: [address] Hall Dayroom   Group Topic: Emotional Recognition, Coping Skills  Goal Area(s) Addresses:  Patient will effectively express themselves through art. Patient will identify a benefit of using art as a coping mechanism.   Behavioral Response: Appropriate   Intervention: Art  Activity: Patient was given a choice of 5 words - security, stability, happiness, laughter, and health. Patient was asked to select the word that most appealed to them. Patient was given color pencils and crayons to color the word as the saw fit.   Education: Clinical biochemist, Building control surveyor.   Education Outcome: Acknowledges understanding   Clinical Observations/Feedback: Patient selected stability. Patient chose not to share why he selected this word and did not show the group what colors he used. Patient made no statements or contributions to group discussion.   Marykay Lex Sienna Stonehocker, LRT/CTRS  Clarisse Rodriges L 06/16/2013 3:03 PM

## 2013-06-16 NOTE — Progress Notes (Signed)
Patient ID: Gerald Ballard, male   DOB: 10-12-1980, 32 y.o.   MRN: [id number] Orders received for patients discharge. Discharge instructions reviewed, AVS copy signed, and copy provided for patient. Pt denies SI/HI / A/V hallucinations at this time. Crisis services reviewed at length, pt verbalized understanding of follow up plan and crisis plans. Pt given free medication supply, and RX. All belongings returned. No further voiced concerns, opportunity for questions provided. Pt escorted from unit via Clinical research associate to cab, cab voucher provided.

## 2013-06-16 NOTE — Progress Notes (Signed)
Seen and agreed. Rutilio Yellowhair, MD 

## 2013-06-16 NOTE — Progress Notes (Signed)
Patient ID: Gerald Ballard, male   DOB: 05-Oct-1980, 32 y.o.   MRN: [id number] D. Patient presents with depressed mood, affect appropriate. Patient states '' I'm doing ok. '' Pt completed self inventory and rates depression at 1/10 on depression scale, 10 being worst depression 1 being least. HE reports sleeping well and appetite improving. Patient states changes he plans to make at discharge are : ''take my medications and stay away from trouble '' Pt remains isolative to room, but is cooperative with programming with prompting. He states, ''The doctor told me I'd be going home today. I do feel ready '' A. Discussed above information with MD/treatment team. Support and encouragement provided. Medications given as ordered. R. Patient is currently calm, cooperative, order received for discharge. Pt alerted to this he states '' I'll call my uncle foster to come pick me up. ''

## 2013-06-19 NOTE — Discharge Summary (Signed)
Seen and agreed. Nashira Mcglynn, MD 

## 2013-06-20 NOTE — Progress Notes (Signed)
Patient Discharge Instructions:  After Visit Summary (AVS):   Faxed to:  06/20/13 Discharge Summary Note:   Faxed to:  06/20/13 Psychiatric Admission Assessment Note:   Faxed to:  06/20/13 Suicide Risk Assessment - Discharge Assessment:   Faxed to:  06/20/13 Faxed/Sent to the Next Level Care provider:  06/20/13 Faxed to [facility] @ [phone]  Jerelene Redden, 06/20/2013, 2:42 PM

## 2013-07-23 IMAGING — CT CT HEAD W/O CM
2 series · 16 of 30 positions shown, 20 images · non-contrast
Comparison: None.

CLINICAL DATA: New onset psychosis with paranoia.

CT HEAD WITHOUT CONTRAST
TECHNIQUE: Contiguous axial images were obtained from the base of
the skull through the vertex without contrast.

[Series 2: head w/o · axial · non-contrast · 0.43mm/px · z∈[-99,+21]mm · 13 of 29 slices shown, 17 images]
[im 3/29  brain]
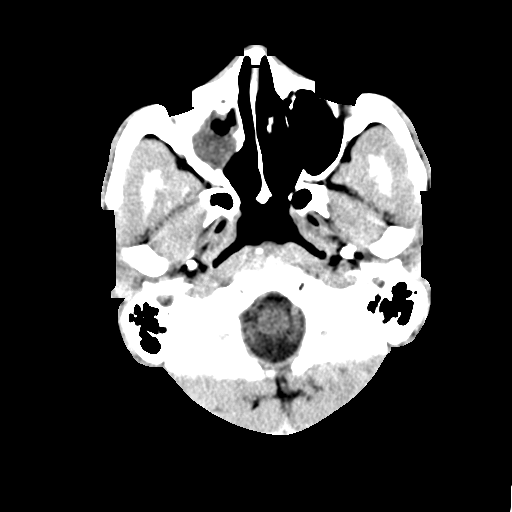
[im 3/29  bone]
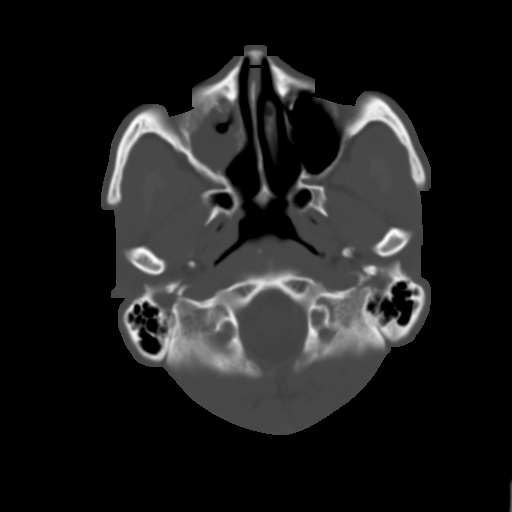
[im 5/29  brain]
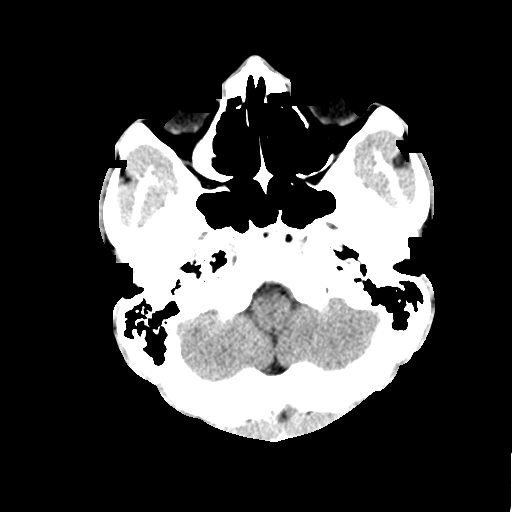
[im 7/29  brain]
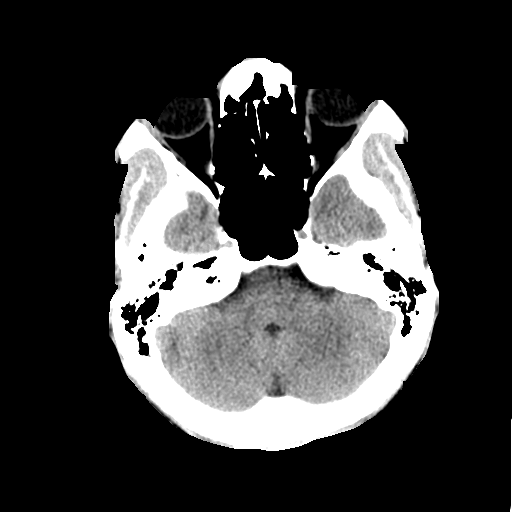
[im 9/29  brain]
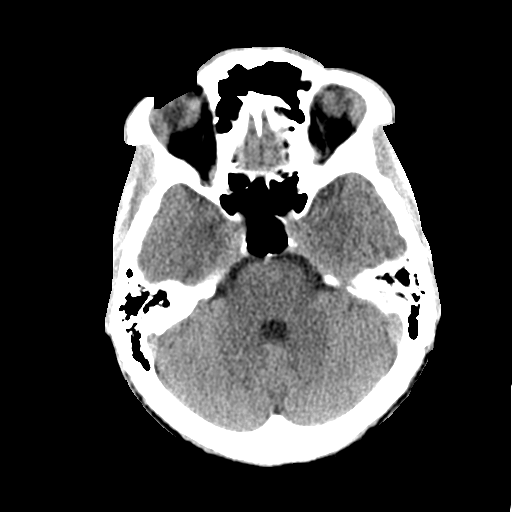
[im 11/29  brain]
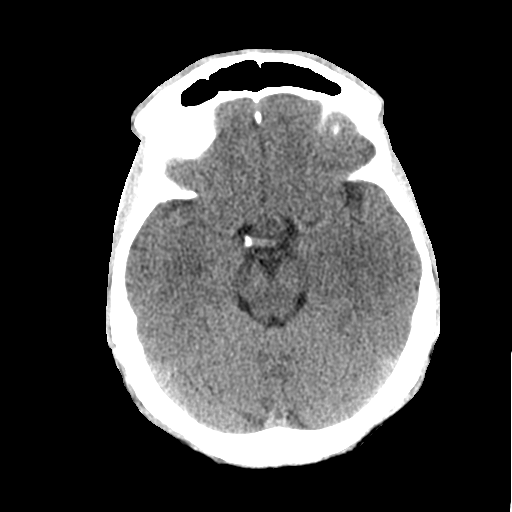
[im 11/29  bone]
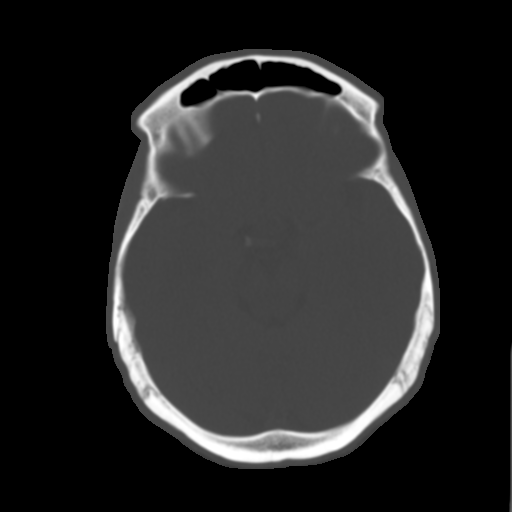
[im 13/29  brain]
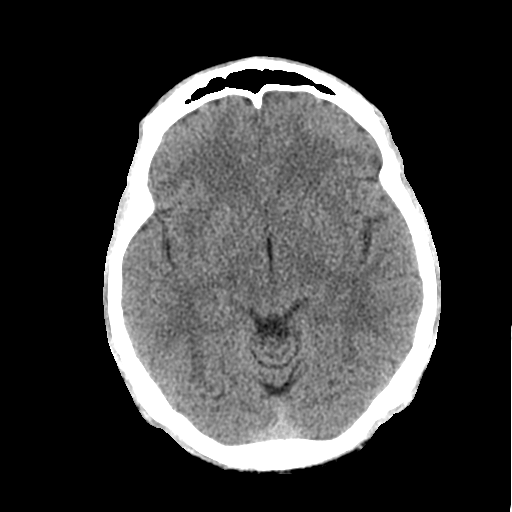
[im 15/29  brain]
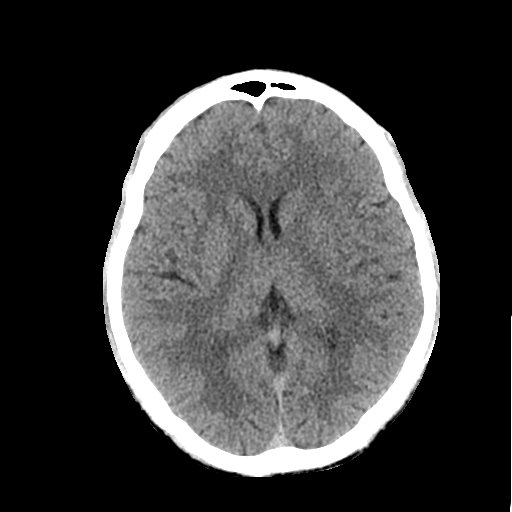
[im 17/29  brain]
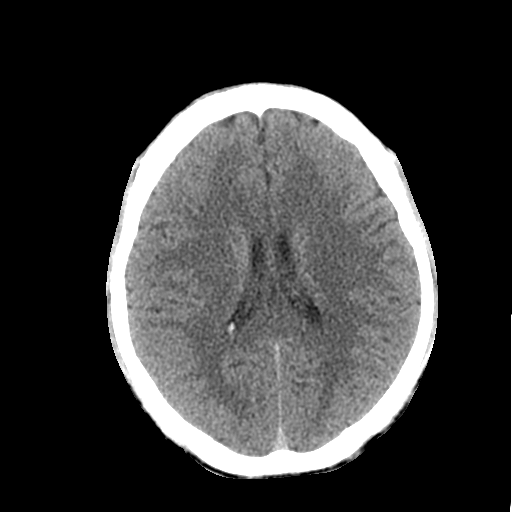
[im 19/29  brain]
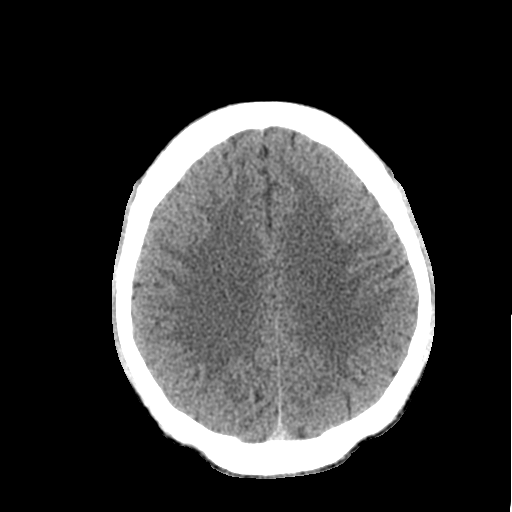
[im 19/29  bone]
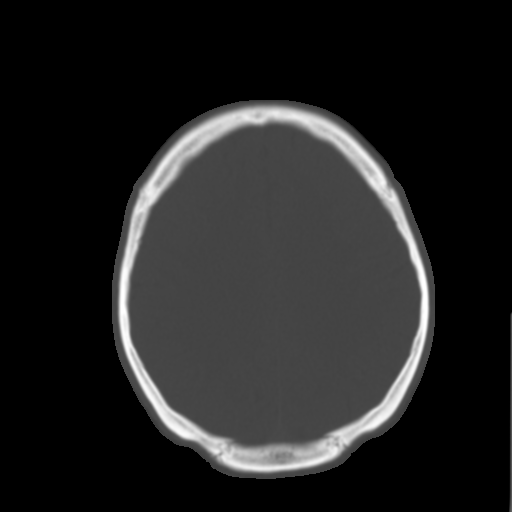
[im 21/29  brain]
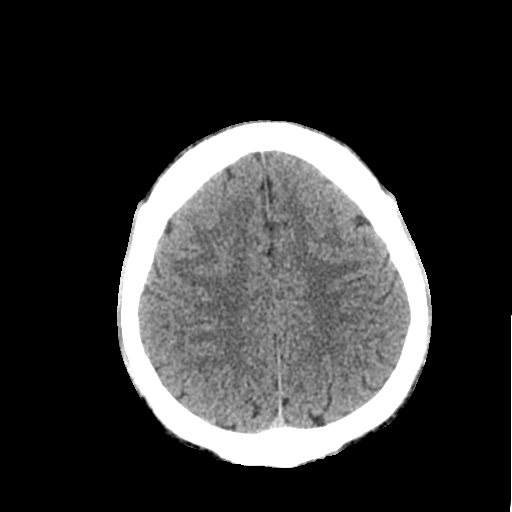
[im 23/29  brain]
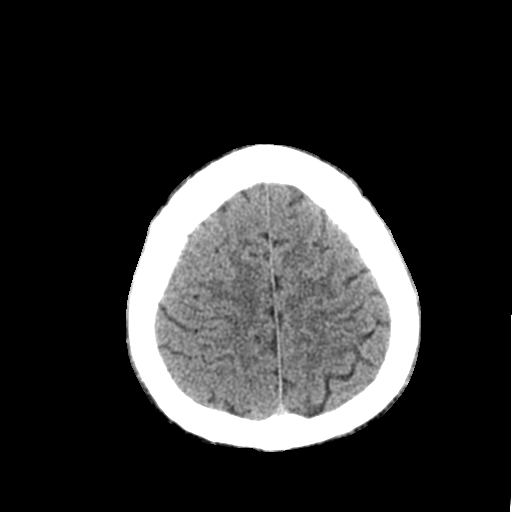
[im 25/29  brain]
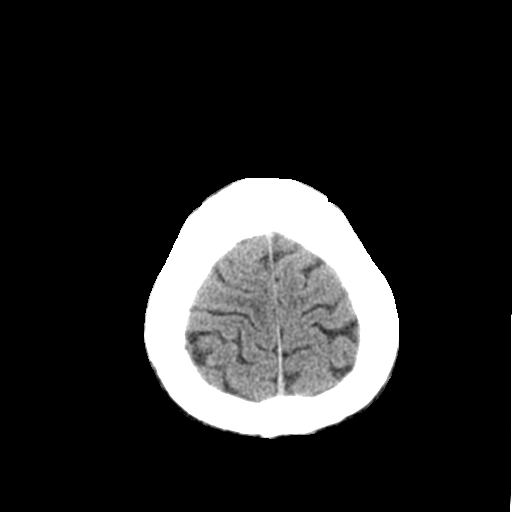
[im 27/29  brain]
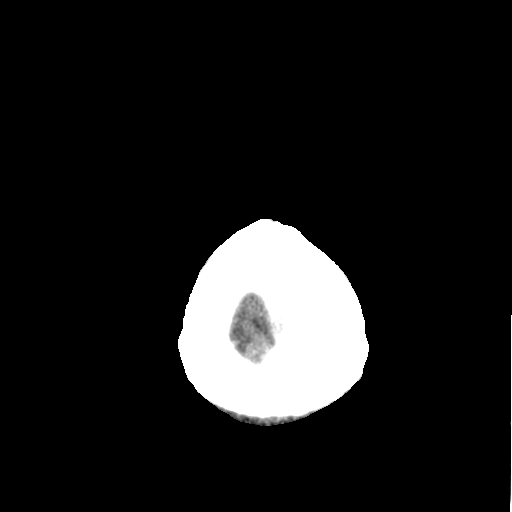
[im 27/29  bone]
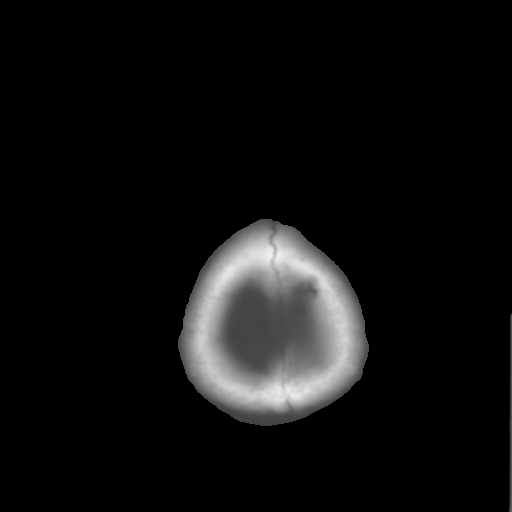

[Series 3: bone windows · axial · 0.43mm/px · z∈[-99,-59]mm · 3 of 29 slices shown]
[im 3/29  bone]
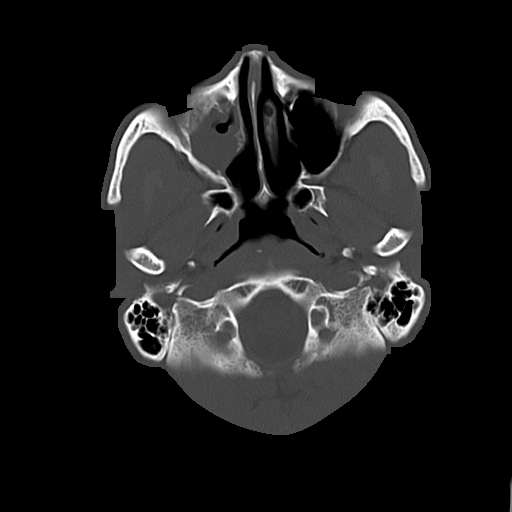
[im 7/29  bone]
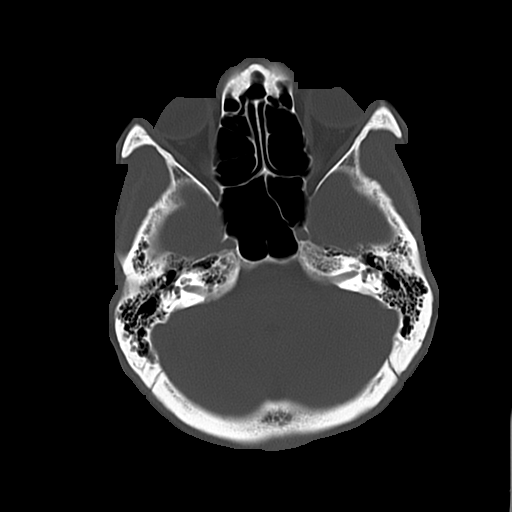
[im 11/29  bone]
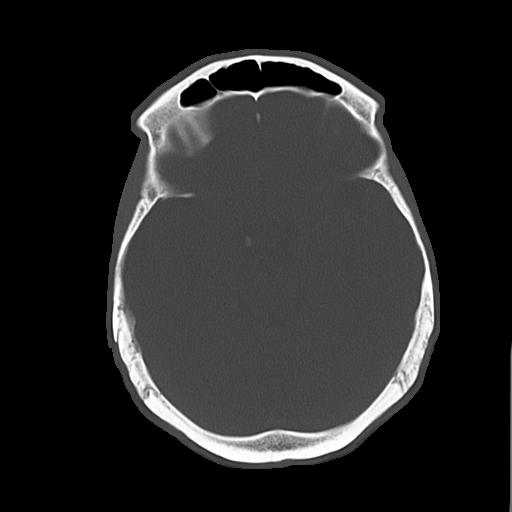

[16 of 30 positions shown; findings below may reference images not displayed]

FINDINGS: Normal appearing cerebral hemispheres and posterior fossa
structures.  Normal size and position of the ventricles.  No
intracranial hemorrhage, mass lesion or CT evidence of acute
infarction.  Normal appearing bones.  Right maxillary sinus mucosal
thickening and fluid.
IMPRESSION: Acute right maxillary sinusitis.  Otherwise, normal examination.

## 2013-07-31 ENCOUNTER — Emergency Department (HOSPITAL_COMMUNITY)
Admission: EM | Admit: 2013-07-31 | Discharge: 2013-08-01 | Disposition: A | Discharge status: Home / Self Care | Payer: Federal, State, Local not specified - Other | Attending: Emergency Medicine | Admitting: Emergency Medicine

## 2013-07-31 ENCOUNTER — Encounter (HOSPITAL_COMMUNITY): Payer: Self-pay | Admitting: Emergency Medicine

## 2013-07-31 DIAGNOSIS — R4689 Other symptoms and signs involving appearance and behavior: Secondary | ICD-10-CM

## 2013-07-31 DIAGNOSIS — F062 Psychotic disorder with delusions due to known physiological condition: Secondary | ICD-10-CM

## 2013-07-31 DIAGNOSIS — F411 Generalized anxiety disorder: Secondary | ICD-10-CM | POA: Insufficient documentation

## 2013-07-31 DIAGNOSIS — F919 Conduct disorder, unspecified: Secondary | ICD-10-CM | POA: Insufficient documentation

## 2013-07-31 DIAGNOSIS — Z79899 Other long term (current) drug therapy: Secondary | ICD-10-CM | POA: Insufficient documentation

## 2013-07-31 DIAGNOSIS — F259 Schizoaffective disorder, unspecified: Secondary | ICD-10-CM | POA: Insufficient documentation

## 2013-07-31 NOTE — ED Notes (Signed)
[facility] tonight his family states that he's pacing the house, having disorganized thoughts and they think he's not taking his medications. Pt denies suicidal thoughts but thinks someone is wanting to kill him.

## 2013-08-01 DIAGNOSIS — F29 Unspecified psychosis not due to a substance or known physiological condition: Secondary | ICD-10-CM

## 2013-08-01 DIAGNOSIS — F259 Schizoaffective disorder, unspecified: Secondary | ICD-10-CM

## 2013-08-01 DIAGNOSIS — R6889 Other general symptoms and signs: Secondary | ICD-10-CM

## 2013-08-01 LAB — COMPREHENSIVE METABOLIC PANEL
ALBUMIN: 4.6 g/dL (ref 3.5–5.2)
ALT: 45 U/L (ref 0–53)
AST: 29 U/L (ref 0–37)
Alkaline Phosphatase: 81 U/L (ref 39–117)
BUN: 10 mg/dL (ref 6–23)
CALCIUM: 9.8 mg/dL (ref 8.4–10.5)
CO2: 27 mEq/L (ref 19–32)
Chloride: 94 mEq/L — ABNORMAL LOW (ref 96–112)
Creatinine, Ser: 0.97 mg/dL (ref 0.50–1.35)
GFR calc non Af Amer: >90 mL/min (ref >90)
GLUCOSE: 112 mg/dL — AB (ref 70–99)
POTASSIUM: 4.2 meq/L (ref 3.7–5.3)
SODIUM: 133 meq/L — AB (ref 137–147)
Total Bilirubin: 1 mg/dL (ref 0.3–1.2)
Total Protein: 8 g/dL (ref 6.0–8.3)

## 2013-08-01 LAB — CBC
HEMATOCRIT: 38.6 % — AB (ref 39.0–52.0)
HEMOGLOBIN: 13.9 g/dL (ref 13.0–17.0)
MCH: 29.9 pg (ref 26.0–34.0)
MCHC: 36 g/dL (ref 30.0–36.0)
MCV: 83 fL (ref 78.0–100.0)
Platelets: 201 10*3/uL (ref 150–400)
RBC: 4.65 MIL/uL (ref 4.22–5.81)
RDW: 12.1 % (ref 11.5–15.5)
WBC: 6.8 10*3/uL (ref 4.0–10.5)

## 2013-08-01 LAB — ETHANOL

## 2013-08-01 LAB — RAPID URINE DRUG SCREEN, HOSP PERFORMED
Amphetamines: NOT DETECTED
BARBITURATES: NOT DETECTED
Benzodiazepines: NOT DETECTED
COCAINE: NOT DETECTED
Opiates: NOT DETECTED
Tetrahydrocannabinol: NOT DETECTED

## 2013-08-01 MED ORDER — ALUM & MAG HYDROXIDE-SIMETH 200-200-20 MG/5ML PO SUSP
30.0000 mL | ORAL | Status: DC | PRN
Start: 2013-08-01 — End: 2013-08-01

## 2013-08-01 MED ORDER — ONDANSETRON HCL 4 MG PO TABS: 4.0000 mg | ORAL_TABLET | Freq: Three times a day (TID) | ORAL | Status: DC | PRN

## 2013-08-01 MED ORDER — ZOLPIDEM TARTRATE 5 MG PO TABS: 5.0000 mg | ORAL_TABLET | Freq: Every evening | ORAL | Status: DC | PRN

## 2013-08-01 MED ORDER — ACETAMINOPHEN 325 MG PO TABS: 650.0000 mg | ORAL_TABLET | ORAL | Status: DC | PRN

## 2013-08-01 MED ORDER — IBUPROFEN 200 MG PO TABS: 600.0000 mg | ORAL_TABLET | Freq: Three times a day (TID) | ORAL | Status: DC | PRN

## 2013-08-01 MED ORDER — LORAZEPAM 1 MG PO TABS: 1.0000 mg | ORAL_TABLET | Freq: Three times a day (TID) | ORAL | Status: DC | PRN

## 2013-08-01 MED ORDER — NICOTINE 21 MG/24HR TD PT24: 21.0000 mg | MEDICATED_PATCH | Freq: Every day | TRANSDERMAL | Status: DC

## 2013-08-01 NOTE — ED Notes (Signed)
Patient belongings--one black jacket, one pair of blue jeans, blue and grey striped belt, grey nike tennis shoes,two black cell phone, black ipod, pair of black socks, red shirt, grey underwear

## 2013-08-01 NOTE — Consult Note (Signed)
Note reviewed and agreed with  

## 2013-08-01 NOTE — ED Provider Notes (Signed)
CSN: [id number]     Arrival date & time 07/31/13  2255 History  This chart was scribed for non-physician practitioner Antony Madura, PA-C, working with April Smitty Cords, MD by Nicholos Johns, ED scribe. This patient was seen in room [facility] and the patient's care was started at 12:06 AM.  Chief Complaint  Patient presents with  . Medical Clearance   The history is provided by the patient and a friend. No language interpreter was used.   HPI Comments: Gerald Ballard is a 33 y.o. male who presents to the Emergency Department. Pt is supposed to be taking psychiatric medication but. Admits to mild anxiety. Denies suicidal thoughts or thoughts of hurting other.  Brought in by friends who state he has been withdrawn, pacing back and forth in the house. Admits to feeling like people are going to kill him; friends say most recent episode was 3 days ago. Pt was in the hospital for psych evaluation a couple of weeks ago. Pts friends state he has no family member in the [place] where he can live. State he has been going from place to place and is thrown out once people start witnessing psychotic episodes. Denies hallucination. Denies drug or alcohol use.   Past Medical History  Diagnosis Date  . No pertinent past medical history   . Mental disorder    Past Surgical History  Procedure Laterality Date  . No past surgeries     Family History  Problem Relation Age of Onset  . Diabetes Father    History  Substance Use Topics  . Smoking status: Never Smoker   . Smokeless tobacco: Not on file  . Alcohol Use: No    Review of Systems  Gastrointestinal: Negative for abdominal pain.  Psychiatric/Behavioral: Negative for hallucinations. The patient is nervous/anxious.   All other systems reviewed and are negative.    Allergies  Pork-derived products  Home Medications   Current Outpatient Rx  Name  Route  Sig  Dispense  Refill  . benztropine (COGENTIN) 1 MG tablet   Oral   Take 1  tablet (1 mg total) by mouth 2 (two) times daily.   30 tablet   0   . FLUoxetine (PROZAC) 10 MG capsule   Oral   Take 3 capsules (30 mg total) by mouth daily.   30 capsule   0   . fluPHENAZine (PROLIXIN) 10 MG tablet   Oral   Take 1 tablet (10 mg total) by mouth 2 (two) times daily after a meal.   60 tablet   0   . lisinopril (PRINIVIL,ZESTRIL) 10 MG tablet   Oral   Take 1 tablet (10 mg total) by mouth daily. For hypertension.   30 tablet   0   . traZODone (DESYREL) 50 MG tablet   Oral   Take 1 tablet (50 mg total) by mouth at bedtime.   30 tablet   0    Triage Vitals: BP 142/95  Pulse 111  Temp(Src) 98.4 F (36.9 C) (Oral)  Resp 18  SpO2 95%  Physical Exam  Nursing note and vitals reviewed. Constitutional: He is oriented to person, place, and time. He appears well-developed and well-nourished. No distress.  HENT:  Head: Normocephalic and atraumatic.  Eyes: Conjunctivae and EOM are normal. No scleral icterus.  Neck: Normal range of motion.  Cardiovascular: Normal rate, regular rhythm and normal heart sounds.   Pulmonary/Chest: Effort normal. No respiratory distress. He has no wheezes. He has no rales. He exhibits no  tenderness.  Abdominal: Soft. He exhibits no distension. There is no tenderness.  Musculoskeletal: Normal range of motion.  Neurological: He is alert and oriented to person, place, and time.  Skin: Skin is warm and dry. No rash noted. He is not diaphoretic. No erythema. No pallor.  Psychiatric: His speech is normal. He is withdrawn. Cognition and memory are normal. He expresses no homicidal and no suicidal ideation. He expresses no suicidal plans and no homicidal plans.  Flat affect    ED Course  Procedures (including critical care time) DIAGNOSTIC STUDIES: Oxygen Saturation is 95% on room air, adequate by my interpretation.    COORDINATION OF CARE: At 12:06 AM: Discussed treatment plan with patient which includes psychiatric evaluation. Patient  agrees.   Labs Review Labs Reviewed  CBC - Abnormal; Notable for the following:    HCT 38.6 (*)    All other components within normal limits  COMPREHENSIVE METABOLIC PANEL - Abnormal; Notable for the following:    Sodium 133 (*)    Chloride 94 (*)    Glucose, Bld 112 (*)    All other components within normal limits  ETHANOL  URINE RAPID DRUG SCREEN (HOSP PERFORMED)   Imaging Review No results found.  EKG Interpretation   None       MDM   1. Abnormal behavior    Patient is a 33 year old male with a history of schizoaffective disorder who presents for abnormal behavior. Family friends of the patient states that he has been very withdrawn with a flat affect. He suspects that he has not been taking his medication as prescribed. He is also been complaining to them recently about suspecting someone of wanting to kill him. Patient with no social ties to the community with no permanent living situations. Patient denies SI/HI, etoh use, and illicit drug use. Patient medically cleared for TTS eval.  0700 - TTS eval pending. Patient would also benefit from social work consult if cleared for d/c by behavioral health team.  I personally performed the services described in this documentation, which was scribed in my presence. The recorded information has been reviewed and is accurate.       Antony MaduraKelly Carrington Mullenax, [place] JerseyPA-C 08/01/13 [phone]

## 2013-08-01 NOTE — Progress Notes (Signed)
[facility] provided pt with a [employer] Orange Card application, highlighting [facility].  °

## 2013-08-01 NOTE — [facility] Suicide Risk Assessment (Signed)
Suicide Risk Assessment  Discharge Assessment     Demographic Factors:  Adolescent or young adult, Living alone and Unemployed  Mental Status Per Nursing Assessment::   On Admission:    unknown  Current Mental Status by Physician: NA  Loss Factors: NA  Historical Factors: Impulsivity  Risk Reduction Factors:   NA  Continued Clinical Symptoms:  Depression:   Delusional Hopelessness Impulsivity Unstable or Poor Therapeutic Relationship Previous Psychiatric Diagnoses and Treatments  Cognitive Features That Contribute To Risk:  Closed-mindedness Loss of executive function Polarized thinking Thought constriction (tunnel vision)    Suicide Risk:  Minimal: No identifiable suicidal ideation.  Patients presenting with no risk factors but with morbid ruminations; may be classified as minimal risk based on the severity of the depressive symptoms  Discharge Diagnoses:   AXIS I:  Schizoaffective Disorder AXIS II:  Deferred AXIS III:   Past Medical History  Diagnosis Date  . No pertinent past medical history   . Mental disorder    AXIS IV:  economic problems, other psychosocial or environmental problems, problems related to legal system/crime, problems related to social environment, problems with access to health care services and problems with primary support group AXIS V:  61-70 mild symptoms  Plan Of Care/Follow-up recommendations:  Activity:  as tolerated Diet:  regular Tests:  na Other:  na  Is patient on multiple antipsychotic therapies at discharge:  No   Has Patient had three or more failed trials of antipsychotic monotherapy by history:  No  Recommended Plan for Multiple Antipsychotic Therapies: NA unun Kendrick FriesBLANKMANN, Machi Whittaker 08/01/2013, 12:59 PM

## 2013-08-01 NOTE — Progress Notes (Signed)
[facility]. Awaiting placement for patient.
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
## 2013-08-01 NOTE — ED Provider Notes (Signed)
Medical screening examination/treatment/procedure(s) were performed by non-physician practitioner and as supervising physician I was immediately available for consultation/collaboration.  EKG Interpretation   None        Evin Chirco K Kendale Rembold-Rasch, MD 08/01/13 [phone]
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
## 2016-10-07 ENCOUNTER — Emergency Department (HOSPITAL_COMMUNITY)
Admission: EM | Admit: 2016-10-07 | Discharge: 2016-10-07 | Disposition: A | Discharge status: Home / Self Care | Payer: BLUE CROSS/BLUE SHIELD | Attending: Emergency Medicine | Admitting: Emergency Medicine

## 2016-10-07 ENCOUNTER — Emergency Department (HOSPITAL_COMMUNITY): Payer: BLUE CROSS/BLUE SHIELD

## 2016-10-07 ENCOUNTER — Encounter (HOSPITAL_COMMUNITY): Payer: Self-pay

## 2016-10-07 DIAGNOSIS — M791 Myalgia, unspecified site: Secondary | ICD-10-CM

## 2016-10-07 DIAGNOSIS — M542 Cervicalgia: Secondary | ICD-10-CM | POA: Diagnosis present

## 2016-10-07 LAB — URINALYSIS, ROUTINE W REFLEX MICROSCOPIC
Bilirubin Urine: NEGATIVE
Glucose, UA: NEGATIVE mg/dL
Hgb urine dipstick: NEGATIVE
KETONES UR: NEGATIVE mg/dL
LEUKOCYTES UA: NEGATIVE
NITRITE: NEGATIVE
PROTEIN: NEGATIVE mg/dL
Specific Gravity, Urine: 1.001 — ABNORMAL LOW (ref 1.005–1.030)
pH: 7 (ref 5.0–8.0)

## 2016-10-07 LAB — COMPREHENSIVE METABOLIC PANEL
ALT: 35 U/L (ref 17–63)
AST: 32 U/L (ref 15–41)
Albumin: 4.6 g/dL (ref 3.5–5.0)
Alkaline Phosphatase: 62 U/L (ref 38–126)
Anion gap: 9 (ref 5–15)
BUN: 5 mg/dL — ABNORMAL LOW (ref 6–20)
CHLORIDE: 101 mmol/L (ref 101–111)
CO2: 27 mmol/L (ref 22–32)
Calcium: 9.5 mg/dL (ref 8.9–10.3)
Creatinine, Ser: 0.92 mg/dL (ref 0.61–1.24)
GFR calc Af Amer: >60 mL/min (ref >60)
Glucose, Bld: 98 mg/dL (ref 65–99)
Potassium: 4 mmol/L (ref 3.5–5.1)
Sodium: 137 mmol/L (ref 135–145)
Total Bilirubin: 1.5 mg/dL — ABNORMAL HIGH (ref 0.3–1.2)
Total Protein: 7.7 g/dL (ref 6.5–8.1)

## 2016-10-07 LAB — CBC
HCT: 42.6 % (ref 39.0–52.0)
Hemoglobin: 15 g/dL (ref 13.0–17.0)
MCH: 29.4 pg (ref 26.0–34.0)
MCHC: 35.2 g/dL (ref 30.0–36.0)
MCV: 83.4 fL (ref 78.0–100.0)
PLATELETS: 253 10*3/uL (ref 150–400)
RBC: 5.11 MIL/uL (ref 4.22–5.81)
RDW: 11.8 % (ref 11.5–15.5)
WBC: 6.3 10*3/uL (ref 4.0–10.5)

## 2016-10-07 LAB — LIPASE, BLOOD: LIPASE: 20 U/L (ref 11–51)

## 2016-10-07 LAB — CBG MONITORING, ED: GLUCOSE-CAPILLARY: 123 mg/dL — AB (ref 65–99)

## 2016-10-07 NOTE — Discharge Instructions (Signed)
The test today were normal.  The cause of your pain is not clear.  Therefore, you need to follow-up with your primary care doctor for further evaluation of the discomfort.  Take Tylenol if needed for pain.

## 2016-10-07 NOTE — ED Triage Notes (Signed)
Pt reports abd pain, neck pain and bilateral foot pain. He also reports intermittent spells of dizziness. Pt states he has been more constipated than usual. LBM yesterday.

## 2016-10-07 NOTE — ED Provider Notes (Signed)
[facility]-EMERGENCY DEPT Provider Note   CSN: [id number] Arrival date & time: 10/07/16  1218     History   Chief Complaint Chief Complaint  Patient presents with  . Abdominal Pain  . Leg Pain  . Neck Pain    HPI Gerald Ballard is a 36 y.o. male.  He presents for evaluation of a constellation of pain including bilateral upper quadrants, suprapubic region, left foot, and posterior neck.  He has had these pains on and off for at least 3 years.  He describes the pain as sharp.  He denies fever, cough, chest pain, weakness or dizziness.  He is taking his usual medications.  He sees a psychiatrist, but not a therapist, currently.  He states that he is taking his usual medications.  There are no other known modifying factors.   HPI  Past Medical History:  Diagnosis Date  . Mental disorder   . No pertinent past medical history     Patient Active Problem List   Diagnosis Date Noted  . Schizoaffective disorder, unspecified condition 06/08/2013  . 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 NO PAST SURGERIES         Home Medications    Prior to Admission medications   Medication Sig Start Date End Date Taking? Authorizing Provider  lisinopril (PRINIVIL,ZESTRIL) 10 MG tablet Take 1 tablet (10 mg total) by mouth daily. For hypertension. 06/16/13  Yes Thermon LeylandLaura A Davis, NP  OLANZapine (ZYPREXA) 20 MG tablet Take 10 mg by mouth daily.   Yes Historical Provider, MD  benztropine (COGENTIN) 1 MG tablet Take 1 tablet (1 mg total) by mouth 2 (two) times daily. Patient not taking: Reported on 10/07/2016 06/16/13   Thermon LeylandLaura A Davis, NP  FLUoxetine (PROZAC) 10 MG capsule Take 3 capsules (30 mg total) by mouth daily. Patient not taking: Reported on 10/07/2016 06/16/13   Thermon LeylandLaura A Davis, NP  fluPHENAZine (PROLIXIN) 10 MG tablet Take 1 tablet (10 mg total) by mouth 2 (two) times daily after a meal. Patient not  taking: Reported on 10/07/2016 06/16/13   Thermon LeylandLaura A Davis, NP  traZODone (DESYREL) 50 MG tablet Take 1 tablet (50 mg total) by mouth at bedtime. Patient not taking: Reported on 10/07/2016 06/16/13   Thermon LeylandLaura A Davis, NP    Family History Family History  Problem Relation Age of Onset  . Diabetes Father     Social History Social History  Substance Use Topics  . Smoking status: Never Smoker  . Smokeless tobacco: Never Used  . Alcohol use No     Allergies   Pork-derived products   Review of Systems Review of Systems  All other systems reviewed and are negative.    Physical Exam Updated Vital Signs BP 125/88   Pulse 99   Temp 98.3 F (36.8 C) (Oral)   Resp 17   Ht 6' (1.829 m)   Wt 169 lb 12.8 oz (77 kg)   SpO2 100%   BMI 23.03 kg/m   Physical Exam  Constitutional: He is oriented to person, place, and time. He appears well-developed and well-nourished. No distress.  HENT:  Head: Normocephalic and atraumatic.  Right Ear: External ear normal.  Left Ear: External ear normal.  Eyes: Conjunctivae and EOM are normal. Pupils are equal, round, and reactive to light.  Neck: Normal range of motion and phonation normal. Neck supple.  Cardiovascular: Normal rate, regular rhythm and normal heart sounds.  Pulmonary/Chest: Effort normal and breath sounds normal. He exhibits no bony tenderness.  Abdominal: Soft. There is no tenderness.  Musculoskeletal: Normal range of motion.  Neurological: He is alert and oriented to person, place, and time. No cranial nerve deficit or sensory deficit. He exhibits normal muscle tone. Coordination normal.  Skin: Skin is warm, dry and intact.  Psychiatric: His behavior is normal. Judgment and thought content normal.  Anxious  Nursing note and vitals reviewed.    ED Treatments / Results  Labs (all labs ordered are listed, but only abnormal results are displayed) Labs Reviewed  COMPREHENSIVE METABOLIC PANEL - Abnormal; Notable for the following:        Result Value   BUN 5 (*)    Total Bilirubin 1.5 (*)    All other components within normal limits  URINALYSIS, ROUTINE W REFLEX MICROSCOPIC - Abnormal; Notable for the following:    Color, Urine COLORLESS (*)    Specific Gravity, Urine 1.001 (*)    All other components within normal limits  CBG MONITORING, ED - Abnormal; Notable for the following:    Glucose-Capillary 123 (*)    All other components within normal limits  LIPASE, BLOOD  CBC    EKG  EKG Interpretation None       Radiology Dg Abdomen 1 View  Result Date: 10/07/2016 CLINICAL DATA:  Flank pain. EXAM: ABDOMEN - 1 VIEW COMPARISON:  No recent. FINDINGS: Soft tissue structures are unremarkable. No bowel distention. Stool in the colon. No acute bony abnormality. IMPRESSION: No acute abnormality. Electronically Signed   By: Maisie Fus  Register   On: 10/07/2016 15:53    Procedures Procedures (including critical care time)  Medications Ordered in ED Medications - No data to display   Initial Impression / Assessment and Plan / ED Course  I have reviewed the triage vital signs and the nursing notes.  Pertinent labs & imaging results that were available during my care of the patient were reviewed by me and considered in my medical decision making (see chart for details).     Medications - No data to display  Patient Vitals for the past 24 hrs:  BP Temp Temp src Pulse Resp SpO2 Height Weight  10/07/16 1606 125/88 - - - - - - -  10/07/16 1223 133/89 98.3 F (36.8 C) Oral 99 17 100 % 6' (1.829 m) 169 lb 12.8 oz (77 kg)    At D/C Reevaluation with update and discussion. After initial assessment and treatment, an updated evaluation reveals he remains comfortable. [name]    Final Clinical Impressions(s) / ED Diagnoses   Final diagnoses:  Myalgia   Nonspecific myalgias, with possible arthralgia component.  Patient is nontoxic.  Doubt serious bacterial infection metabolic instability or impending  vascular collapse.  Nursing Notes Reviewed/ Care Coordinated Applicable Imaging Reviewed Interpretation of Laboratory Data incorporated into ED treatment  The patient appears reasonably screened and/or stabilized for discharge and I doubt any other medical condition or other [employer] requiring further screening, evaluation, or treatment in the ED at this time prior to discharge.  Plan: Home Medications-OTC analgesia; Home Treatments-rest, fluids; return here if the recommended treatment, does not improve the symptoms; Recommended follow up-PCP checkup 1 week.    New Prescriptions Discharge Medication List as of 10/07/2016  5:32 PM       Mancel Bale, MD 10/07/16 [phone]

## 2017-10-02 IMAGING — CR DG ABDOMEN 1V
1 series · 1 of 1 positions shown · non-contrast
Comparison: No recent.

CLINICAL DATA: Flank pain.

EXAM:
ABDOMEN - 1 VIEW

[abdomen kub]
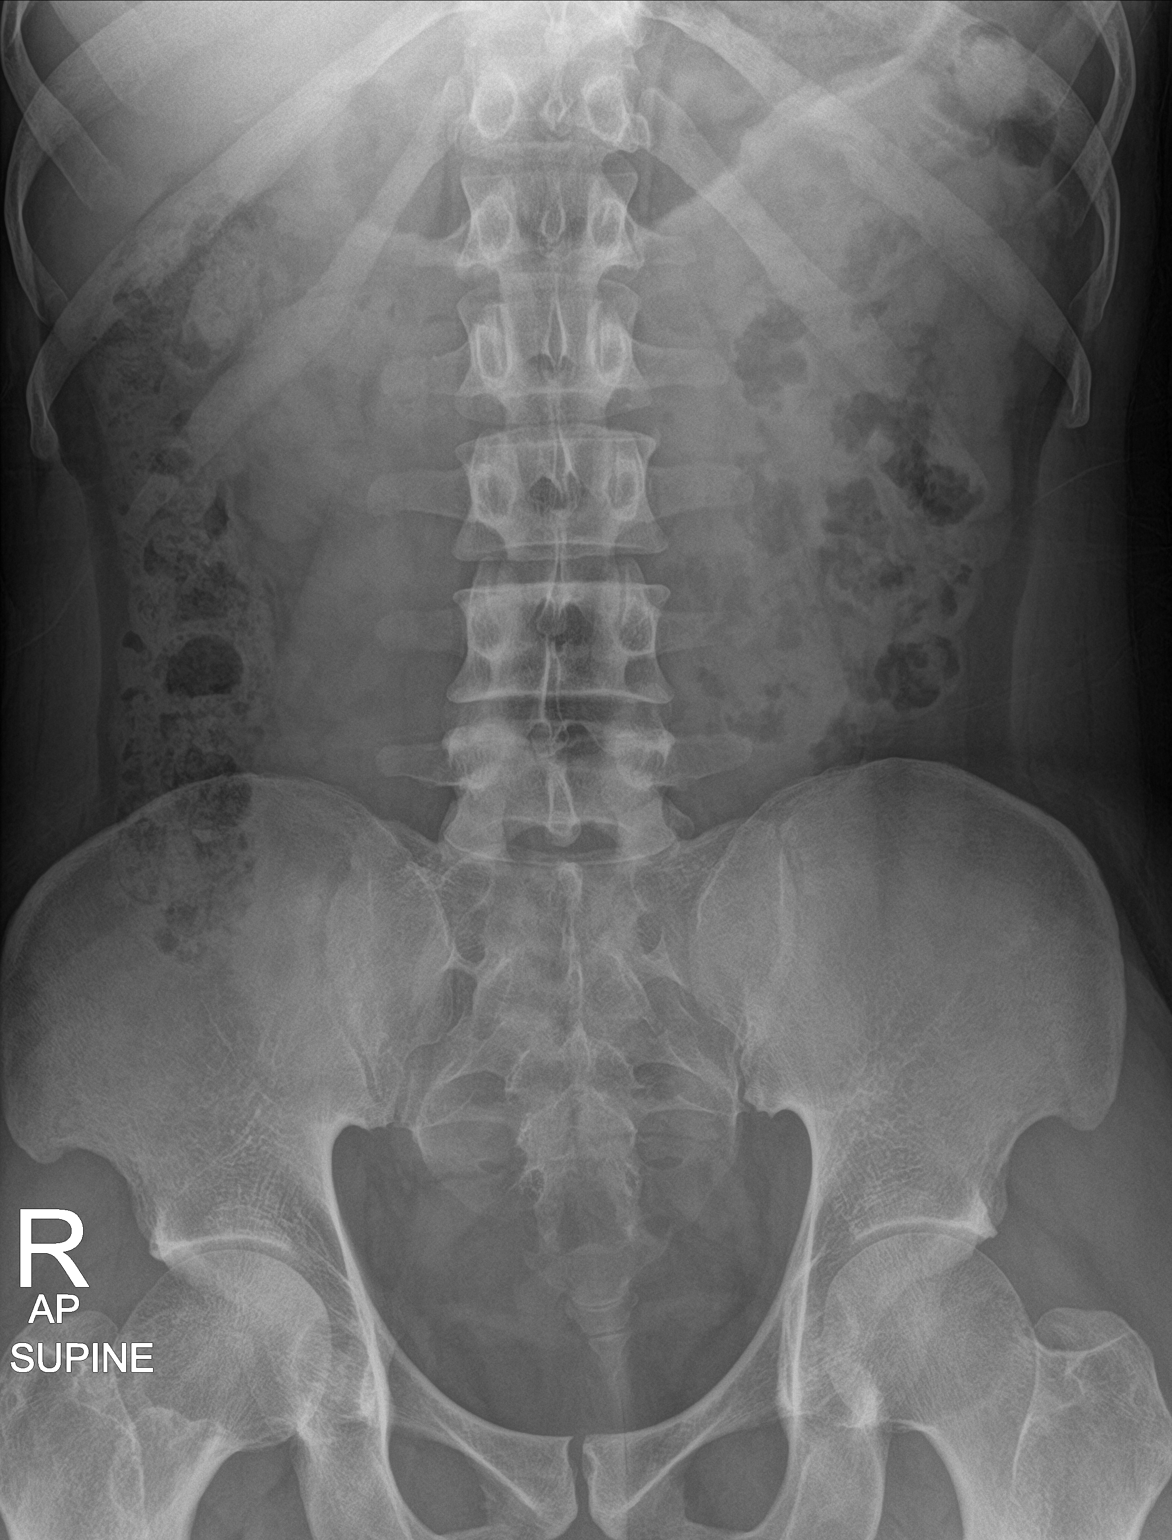

[1 of 1 positions shown; findings below may reference images not displayed]

FINDINGS: Soft tissue structures are unremarkable. No bowel distention. Stool
in the colon. No acute bony abnormality.
IMPRESSION: No acute abnormality.
